# Patient Record
Sex: Female | Born: 1960 | Race: White | Hispanic: No | Smoking: Current every day smoker
Health system: Southern US, Community
[De-identification: ages and names within clinical notes are randomized; demographics above are authoritative.]

## PROBLEM LIST (undated history)

## (undated) DIAGNOSIS — S83249A Other tear of medial meniscus, current injury, unspecified knee, initial encounter: Secondary | ICD-10-CM

## (undated) DIAGNOSIS — T4145XA Adverse effect of unspecified anesthetic, initial encounter: Secondary | ICD-10-CM

## (undated) DIAGNOSIS — F32A Depression, unspecified: Secondary | ICD-10-CM

## (undated) DIAGNOSIS — F329 Major depressive disorder, single episode, unspecified: Secondary | ICD-10-CM

## (undated) DIAGNOSIS — F419 Anxiety disorder, unspecified: Secondary | ICD-10-CM

## (undated) DIAGNOSIS — T8859XA Other complications of anesthesia, initial encounter: Secondary | ICD-10-CM

## (undated) DIAGNOSIS — M1990 Unspecified osteoarthritis, unspecified site: Secondary | ICD-10-CM

## (undated) DIAGNOSIS — I10 Essential (primary) hypertension: Secondary | ICD-10-CM

## (undated) HISTORY — PX: ANKLE SURGERY: SHX546

## (undated) HISTORY — PX: CHOLECYSTECTOMY: SHX55

## (undated) HISTORY — PX: KNEE SURGERY: SHX244

## (undated) HISTORY — PX: SHOULDER ARTHROSCOPY: SHX128

---

## 2012-12-05 DIAGNOSIS — G43C0 Periodic headache syndromes in child or adult, not intractable: Secondary | ICD-10-CM | POA: Insufficient documentation

## 2012-12-05 DIAGNOSIS — J4522 Mild intermittent asthma with status asthmaticus: Secondary | ICD-10-CM | POA: Insufficient documentation

## 2012-12-05 DIAGNOSIS — E669 Obesity, unspecified: Secondary | ICD-10-CM | POA: Insufficient documentation

## 2012-12-05 DIAGNOSIS — G4700 Insomnia, unspecified: Secondary | ICD-10-CM | POA: Insufficient documentation

## 2012-12-05 DIAGNOSIS — F32A Depression, unspecified: Secondary | ICD-10-CM | POA: Insufficient documentation

## 2013-03-31 DIAGNOSIS — F17200 Nicotine dependence, unspecified, uncomplicated: Secondary | ICD-10-CM | POA: Insufficient documentation

## 2013-03-31 DIAGNOSIS — R921 Mammographic calcification found on diagnostic imaging of breast: Secondary | ICD-10-CM | POA: Insufficient documentation

## 2013-04-22 DIAGNOSIS — J4489 Other specified chronic obstructive pulmonary disease: Secondary | ICD-10-CM | POA: Insufficient documentation

## 2013-10-11 DIAGNOSIS — E785 Hyperlipidemia, unspecified: Secondary | ICD-10-CM | POA: Insufficient documentation

## 2013-10-11 DIAGNOSIS — F411 Generalized anxiety disorder: Secondary | ICD-10-CM | POA: Insufficient documentation

## 2013-10-11 DIAGNOSIS — I10 Essential (primary) hypertension: Secondary | ICD-10-CM | POA: Insufficient documentation

## 2014-11-11 ENCOUNTER — Other Ambulatory Visit: Payer: Self-pay | Admitting: Orthopedic Surgery

## 2014-11-11 ENCOUNTER — Encounter (HOSPITAL_BASED_OUTPATIENT_CLINIC_OR_DEPARTMENT_OTHER): Payer: Self-pay | Admitting: *Deleted

## 2014-11-15 ENCOUNTER — Other Ambulatory Visit: Payer: Self-pay

## 2014-11-15 ENCOUNTER — Encounter (HOSPITAL_BASED_OUTPATIENT_CLINIC_OR_DEPARTMENT_OTHER)
Admission: RE | Admit: 2014-11-15 | Discharge: 2014-11-15 | Disposition: A | Discharge status: Home / Self Care | Payer: 59 | Source: Ambulatory Visit | Attending: Orthopedic Surgery | Admitting: Orthopedic Surgery

## 2014-11-15 DIAGNOSIS — Z9049 Acquired absence of other specified parts of digestive tract: Secondary | ICD-10-CM | POA: Diagnosis not present

## 2014-11-15 DIAGNOSIS — Z87891 Personal history of nicotine dependence: Secondary | ICD-10-CM | POA: Diagnosis not present

## 2014-11-15 DIAGNOSIS — F419 Anxiety disorder, unspecified: Secondary | ICD-10-CM | POA: Diagnosis not present

## 2014-11-15 DIAGNOSIS — Z886 Allergy status to analgesic agent status: Secondary | ICD-10-CM | POA: Diagnosis not present

## 2014-11-15 DIAGNOSIS — F329 Major depressive disorder, single episode, unspecified: Secondary | ICD-10-CM | POA: Diagnosis not present

## 2014-11-15 DIAGNOSIS — M2342 Loose body in knee, left knee: Secondary | ICD-10-CM | POA: Diagnosis present

## 2014-11-15 DIAGNOSIS — I10 Essential (primary) hypertension: Secondary | ICD-10-CM | POA: Diagnosis not present

## 2014-11-15 DIAGNOSIS — M94262 Chondromalacia, left knee: Secondary | ICD-10-CM | POA: Diagnosis not present

## 2014-11-15 NOTE — H&P (Signed)
Paige Woods is an 54 y.o. female.    Chief Complaint: Left Knee Pain  HPI: Patient is here today for follow up for left knee pain.  This patient was last seen on 08/04/14 at that time she received a cortisone injection.  She states that the injection did provide approximately 75% relief.  She has noticed continued popping and some mild pain.  She denies any new injury.  She denies any fevers chills night sweats or other signs of infection.  Again she does have a prior history of partial meniscectomy done in [place] approximately 3-4 years ago.  She denies any adverse effects from the injection.  She denies any fevers chills night sweats or other signs of infection.  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  Past Medical History  Diagnosis Date  . Hypertension   . Depression   . Anxiety   . Arthritis   . Complication of anesthesia     PONV  . Acute medial meniscal tear     left    Past Surgical History  Procedure Laterality Date  . Cholecystectomy    . Knee surgery Left   . Ankle surgery Right   . Shoulder arthroscopy Left   . Cesarean section      History reviewed. No pertinent family history. Social History:  reports that she quit smoking about 4 weeks ago. She does not have any smokeless tobacco history on file. She reports that she does not drink alcohol or use illicit drugs.  Allergies:  Allergies  Allergen Reactions  . Ketoprofen Nausea And Vomiting  . Tramadol Nausea And Vomiting    No prescriptions prior to admission    No results found for this or any previous visit (from the past 48 hour(s)). No results found.  Review of Systems  Constitutional: Negative.   HENT: Negative.   Eyes:       Glasses  Respiratory: Negative.   Cardiovascular: Negative.   Gastrointestinal: Negative.   Genitourinary: Negative.   Musculoskeletal: Positive for joint pain.  Skin: Negative.   Neurological:  Negative.   Endo/Heme/Allergies: Negative.   Psychiatric/Behavioral: Positive for depression. The patient is nervous/anxious.     Height 6\' 2"  (1.88 m), weight 90.719 kg (200 lb). Physical Exam  Constitutional: She is oriented to person, place, and time. She appears well-developed and well-nourished.  HENT:  Head: Normocephalic and atraumatic.  Eyes: Pupils are equal, round, and reactive to light.  Neck: Normal range of motion. Neck supple.  Cardiovascular: Intact distal pulses.   Respiratory: Effort normal.  Musculoskeletal: She exhibits tenderness.  Patient continues to notice pain over the medial and lateral joint lines.  She has a range from 0-140.  McMurray's test continues to cause palpable clicking.  She has brisk capillary refill and is neurovascularly intact distally.  Neurological: She is alert and oriented to person, place, and time.  Skin: Skin is warm and dry.  Psychiatric: She has a normal mood and affect. Her behavior is normal. Judgment and thought content normal.     Assessment/Plan Assess: Possible meniscal tear versus chondromalacia  Plan: Options are once again discussed with the patient.  Patient states that as she is 75% better today she would like to continue with conservative treatment for the next several weeks to possibly a couple months.  She does wish to discuss surgery in the future.  She has elected to rule out a  posting slip and then call Paige Woods in the future when she would like to schedule and proceed with surgery.  She is given Paige Woods card.  She may continue with rest ice and over-the-counter medications as needed for relief.  Follow up as needed.  Call with any issues.  PHILLIPS, ERIC R 11/15/2014, 12:40 PM

## 2014-11-15 NOTE — Progress Notes (Signed)
Called [facility] in [place] and had them fax labs done first of May, 2016.

## 2014-11-16 ENCOUNTER — Ambulatory Visit (HOSPITAL_BASED_OUTPATIENT_CLINIC_OR_DEPARTMENT_OTHER)
Admission: RE | Admit: 2014-11-16 | Discharge: 2014-11-16 | Disposition: A | Discharge status: Home / Self Care | Payer: 59 | Source: Ambulatory Visit | Attending: Orthopedic Surgery | Admitting: Orthopedic Surgery

## 2014-11-16 ENCOUNTER — Ambulatory Visit (HOSPITAL_BASED_OUTPATIENT_CLINIC_OR_DEPARTMENT_OTHER): Payer: 59 | Admitting: Anesthesiology

## 2014-11-16 ENCOUNTER — Encounter (HOSPITAL_BASED_OUTPATIENT_CLINIC_OR_DEPARTMENT_OTHER): Payer: Self-pay

## 2014-11-16 ENCOUNTER — Encounter (HOSPITAL_BASED_OUTPATIENT_CLINIC_OR_DEPARTMENT_OTHER)
Admission: RE | Disposition: A | Discharge status: Home / Self Care | Payer: Self-pay | Source: Ambulatory Visit | Attending: Orthopedic Surgery

## 2014-11-16 DIAGNOSIS — M94262 Chondromalacia, left knee: Secondary | ICD-10-CM | POA: Diagnosis not present

## 2014-11-16 DIAGNOSIS — I10 Essential (primary) hypertension: Secondary | ICD-10-CM | POA: Insufficient documentation

## 2014-11-16 DIAGNOSIS — Z9049 Acquired absence of other specified parts of digestive tract: Secondary | ICD-10-CM | POA: Insufficient documentation

## 2014-11-16 DIAGNOSIS — M2342 Loose body in knee, left knee: Secondary | ICD-10-CM | POA: Insufficient documentation

## 2014-11-16 DIAGNOSIS — F419 Anxiety disorder, unspecified: Secondary | ICD-10-CM | POA: Insufficient documentation

## 2014-11-16 DIAGNOSIS — F329 Major depressive disorder, single episode, unspecified: Secondary | ICD-10-CM | POA: Insufficient documentation

## 2014-11-16 DIAGNOSIS — Z886 Allergy status to analgesic agent status: Secondary | ICD-10-CM | POA: Insufficient documentation

## 2014-11-16 DIAGNOSIS — Z87891 Personal history of nicotine dependence: Secondary | ICD-10-CM | POA: Insufficient documentation

## 2014-11-16 HISTORY — DX: Adverse effect of unspecified anesthetic, initial encounter: T41.45XA

## 2014-11-16 HISTORY — DX: Other tear of medial meniscus, current injury, unspecified knee, initial encounter: S83.249A

## 2014-11-16 HISTORY — DX: Major depressive disorder, single episode, unspecified: F32.9

## 2014-11-16 HISTORY — DX: Unspecified osteoarthritis, unspecified site: M19.90

## 2014-11-16 HISTORY — DX: Other complications of anesthesia, initial encounter: T88.59XA

## 2014-11-16 HISTORY — DX: Essential (primary) hypertension: I10

## 2014-11-16 HISTORY — DX: Anxiety disorder, unspecified: F41.9

## 2014-11-16 HISTORY — PX: CHONDROPLASTY: SHX5177

## 2014-11-16 HISTORY — PX: KNEE ARTHROSCOPY WITH DRILLING/MICROFRACTURE: SHX6425

## 2014-11-16 HISTORY — DX: Depression, unspecified: F32.A

## 2014-11-16 LAB — POCT HEMOGLOBIN-HEMACUE: Hemoglobin: 13.6 g/dL (ref 12.0–15.0)

## 2014-11-16 SURGERY — ARTHROSCOPY, KNEE, WITH ABRASION ARTHROPLASTY OR MICROFRACTURE
Anesthesia: General | Site: Knee | Laterality: Left

## 2014-11-16 MED ORDER — HYDROCODONE-ACETAMINOPHEN 5-325 MG PO TABS: 1.0000 | ORAL_TABLET | Freq: Four times a day (QID) | ORAL | Status: DC | PRN

## 2014-11-16 MED ORDER — HYDROMORPHONE HCL 1 MG/ML IJ SOLN
INTRAMUSCULAR | Status: AC
  Filled 2014-11-16: qty 1

## 2014-11-16 MED ORDER — OXYCODONE HCL 5 MG PO TABS
5.0000 mg | ORAL_TABLET | Freq: Once | ORAL | Status: AC | PRN
  Administered 2014-11-16: 5 mg via ORAL

## 2014-11-16 MED ORDER — DEXAMETHASONE SODIUM PHOSPHATE 4 MG/ML IJ SOLN
INTRAMUSCULAR | Status: DC | PRN
  Administered 2014-11-16: 10 mg via INTRAVENOUS

## 2014-11-16 MED ORDER — HYDROMORPHONE HCL 1 MG/ML IJ SOLN
0.2500 mg | INTRAMUSCULAR | Status: DC | PRN
  Administered 2014-11-16: 0.5 mg via INTRAVENOUS

## 2014-11-16 MED ORDER — EPINEPHRINE HCL 1 MG/ML IJ SOLN
INTRAMUSCULAR | Status: AC
  Filled 2014-11-16: qty 1

## 2014-11-16 MED ORDER — GLYCOPYRROLATE 0.2 MG/ML IJ SOLN: 0.2000 mg | Freq: Once | INTRAMUSCULAR | Status: DC | PRN

## 2014-11-16 MED ORDER — OXYCODONE HCL 5 MG/5ML PO SOLN: 5.0000 mg | Freq: Once | ORAL | Status: AC | PRN

## 2014-11-16 MED ORDER — PROPOFOL 10 MG/ML IV BOLUS
INTRAVENOUS | Status: AC
  Filled 2014-11-16: qty 20

## 2014-11-16 MED ORDER — LIDOCAINE HCL (CARDIAC) 20 MG/ML IV SOLN
INTRAVENOUS | Status: DC | PRN
  Administered 2014-11-16: 50 mg via INTRAVENOUS

## 2014-11-16 MED ORDER — PROPOFOL 10 MG/ML IV BOLUS
INTRAVENOUS | Status: DC | PRN
  Administered 2014-11-16: 200 mg via INTRAVENOUS

## 2014-11-16 MED ORDER — EPINEPHRINE HCL 1 MG/ML IJ SOLN
INTRAMUSCULAR | Status: DC | PRN
  Administered 2014-11-16: 1 mg

## 2014-11-16 MED ORDER — FENTANYL CITRATE (PF) 100 MCG/2ML IJ SOLN: 50.0000 ug | INTRAMUSCULAR | Status: DC | PRN

## 2014-11-16 MED ORDER — LACTATED RINGERS IV SOLN
INTRAVENOUS | Status: DC
Start: 2014-11-16 — End: 2014-11-16
  Administered 2014-11-16 (×2): via INTRAVENOUS

## 2014-11-16 MED ORDER — MIDAZOLAM HCL 2 MG/2ML IJ SOLN
1.0000 mg | INTRAMUSCULAR | Status: DC | PRN
  Administered 2014-11-16: 2 mg via INTRAVENOUS

## 2014-11-16 MED ORDER — CEFAZOLIN SODIUM-DEXTROSE 2-3 GM-% IV SOLR: 2.0000 g | INTRAVENOUS | Status: DC

## 2014-11-16 MED ORDER — FENTANYL CITRATE (PF) 100 MCG/2ML IJ SOLN
INTRAMUSCULAR | Status: DC | PRN
  Administered 2014-11-16: 100 ug via INTRAVENOUS

## 2014-11-16 MED ORDER — BUPIVACAINE-EPINEPHRINE 0.5% -1:200000 IJ SOLN
INTRAMUSCULAR | Status: DC | PRN
  Administered 2014-11-16: 20 mL

## 2014-11-16 MED ORDER — OXYCODONE HCL 5 MG PO TABS
ORAL_TABLET | ORAL | Status: AC
  Filled 2014-11-16: qty 1

## 2014-11-16 MED ORDER — MIDAZOLAM HCL 2 MG/2ML IJ SOLN
INTRAMUSCULAR | Status: AC
  Filled 2014-11-16: qty 2

## 2014-11-16 MED ORDER — ONDANSETRON HCL 4 MG/2ML IJ SOLN
INTRAMUSCULAR | Status: DC | PRN
  Administered 2014-11-16: 4 mg via INTRAVENOUS

## 2014-11-16 MED ORDER — FENTANYL CITRATE (PF) 100 MCG/2ML IJ SOLN
INTRAMUSCULAR | Status: AC
  Filled 2014-11-16: qty 4

## 2014-11-16 MED ORDER — SODIUM CHLORIDE 0.9 % IR SOLN
Status: DC | PRN
  Administered 2014-11-16: 6000 mL

## 2014-11-16 MED ORDER — CHLORHEXIDINE GLUCONATE 4 % EX LIQD: 60.0000 mL | Freq: Once | CUTANEOUS | Status: DC

## 2014-11-16 MED ORDER — MEPERIDINE HCL 25 MG/ML IJ SOLN: 6.2500 mg | INTRAMUSCULAR | Status: DC | PRN

## 2014-11-16 SURGICAL SUPPLY — 41 items
BANDAGE ELASTIC 6 VELCRO ST LF (GAUZE/BANDAGES/DRESSINGS) ×3 IMPLANT
BLADE 4.2CUDA (BLADE) IMPLANT
BLADE CUTTER GATOR 3.5 (BLADE) ×3 IMPLANT
BLADE GREAT WHITE 4.2 (BLADE) IMPLANT
BNDG COHESIVE 6X5 TAN STRL LF (GAUZE/BANDAGES/DRESSINGS) ×3 IMPLANT
DRAPE ARTHROSCOPY W/POUCH 114 (DRAPES) ×3 IMPLANT
DURAPREP 26ML APPLICATOR (WOUND CARE) ×3 IMPLANT
ELECT MENISCUS 165MM 90D (ELECTRODE) IMPLANT
ELECT REM PT RETURN 9FT ADLT (ELECTROSURGICAL)
ELECTRODE REM PT RTRN 9FT ADLT (ELECTROSURGICAL) IMPLANT
GAUZE SPONGE 4X4 12PLY STRL (GAUZE/BANDAGES/DRESSINGS) ×3 IMPLANT
GAUZE XEROFORM 1X8 LF (GAUZE/BANDAGES/DRESSINGS) ×3 IMPLANT
GLOVE BIO SURGEON STRL SZ 6.5 (GLOVE) ×3 IMPLANT
GLOVE BIO SURGEON STRL SZ7.5 (GLOVE) ×3 IMPLANT
GLOVE BIO SURGEON STRL SZ8.5 (GLOVE) ×3 IMPLANT
GLOVE BIOGEL PI IND STRL 7.0 (GLOVE) ×2 IMPLANT
GLOVE BIOGEL PI IND STRL 8 (GLOVE) ×2 IMPLANT
GLOVE BIOGEL PI IND STRL 9 (GLOVE) ×2 IMPLANT
GLOVE BIOGEL PI INDICATOR 7.0 (GLOVE) ×1
GLOVE BIOGEL PI INDICATOR 8 (GLOVE) ×1
GLOVE BIOGEL PI INDICATOR 9 (GLOVE) ×1
GOWN STRL REUS W/ TWL LRG LVL3 (GOWN DISPOSABLE) ×4 IMPLANT
GOWN STRL REUS W/TWL LRG LVL3 (GOWN DISPOSABLE) ×2
GOWN STRL REUS W/TWL XL LVL3 (GOWN DISPOSABLE) ×3 IMPLANT
IV NS IRRIG 3000ML ARTHROMATIC (IV SOLUTION) ×6 IMPLANT
KNEE WRAP E Z 3 GEL PACK (MISCELLANEOUS) ×3 IMPLANT
MANIFOLD NEPTUNE II (INSTRUMENTS) ×3 IMPLANT
NDL SAFETY ECLIPSE 18X1.5 (NEEDLE) ×2 IMPLANT
NEEDLE HYPO 18GX1.5 SHARP (NEEDLE) ×1
PACK ARTHROSCOPY DSU (CUSTOM PROCEDURE TRAY) ×3 IMPLANT
PACK BASIN DAY SURGERY FS (CUSTOM PROCEDURE TRAY) ×3 IMPLANT
PAD ALCOHOL SWAB (MISCELLANEOUS) ×3 IMPLANT
PENCIL BUTTON HOLSTER BLD 10FT (ELECTRODE) IMPLANT
SET ARTHROSCOPY TUBING (MISCELLANEOUS) ×1
SET ARTHROSCOPY TUBING LN (MISCELLANEOUS) ×2 IMPLANT
SLEEVE SCD COMPRESS KNEE MED (MISCELLANEOUS) ×3 IMPLANT
SYR 3ML 18GX1 1/2 (SYRINGE) IMPLANT
SYR 5ML LL (SYRINGE) ×3 IMPLANT
TOWEL OR 17X24 6PK STRL BLUE (TOWEL DISPOSABLE) ×3 IMPLANT
WAND STAR VAC 90 (SURGICAL WAND) IMPLANT
WATER STERILE IRR 1000ML POUR (IV SOLUTION) ×3 IMPLANT

## 2014-11-16 NOTE — Anesthesia Postprocedure Evaluation (Signed)
  Anesthesia Post-op Note  Patient: Paige Woods  Procedure(s) Performed: Procedure(s): LEFT ARTHROSCOPY KNEE (Left) KNEE ARTHROSCOPY WITH DRILLING/MICROFRACTURE (Left) CHONDROPLASTY (Left)  Patient Location: PACU  Anesthesia Type: General   Level of Consciousness: awake, alert  and oriented  Airway and Oxygen Therapy: Patient Spontanous Breathing  Post-op Pain: mild  Post-op Assessment: Post-op Vital signs reviewed  Post-op Vital Signs: Reviewed  Last Vitals:  Filed Vitals:   11/16/14 1315  BP: 103/65  Pulse: 84  Temp: 36.6 C  Resp: 20    Complications: No apparent anesthesia complications

## 2014-11-16 NOTE — Transfer of Care (Signed)
Immediate Anesthesia Transfer of Care Note  Patient: Paige Woods  Procedure(s) Performed: Procedure(s): LEFT ARTHROSCOPY KNEE (Left) KNEE ARTHROSCOPY WITH DRILLING/MICROFRACTURE (Left) CHONDROPLASTY (Left)  Patient Location: PACU  Anesthesia Type:General  Level of Consciousness: awake, alert , oriented and patient cooperative  Airway & Oxygen Therapy: Patient Spontanous Breathing and Patient connected to face mask oxygen  Post-op Assessment: Report given to RN and Post -op Vital signs reviewed and stable  Post vital signs: Reviewed and stable  Last Vitals:  Filed Vitals:   11/16/14 1024  BP: 110/64  Pulse: 96  Temp: 36.6 C  Resp: 18    Complications: No apparent anesthesia complications

## 2014-11-16 NOTE — Anesthesia Procedure Notes (Signed)
Procedure Name: LMA Insertion Performed by: Ainslee Sou,  Pre-anesthesia Checklist: Patient identified, Emergency Drugs available, Suction available and Patient being monitored Patient Re-evaluated:Patient Re-evaluated prior to inductionOxygen Delivery Method: Circle System Utilized Preoxygenation: Pre-oxygenation with 100% oxygen Intubation Type: IV induction Ventilation: Mask ventilation without difficulty LMA: LMA inserted LMA Size: 4.0 Number of attempts: 1 Airway Equipment and Method: Bite block Placement Confirmation: positive ETCO2 Tube secured with: Tape Dental Injury: Teeth and Oropharynx as per pre-operative assessment      

## 2014-11-16 NOTE — Op Note (Signed)
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   Procedure: Left knee arthroscopic removal of loose bodies, debridement of flap tears, microfracture of trochlea.  Surgeon: Feliberto GottronFrank J. Turner Danielsowan M.D.  Assist: Tomi LikensEric K. Gaylene BrooksPhillips PA-C  (present throughout entire procedure and necessary for timely completion of the procedure) Anes: General LMA  EBL: Minimal  Fluids: 800 cc   Indications: Catching popping and pain in the left knee. Pain began after injury. Pt has failed conservative treatment with anti-inflammatory medicines, physical therapy, and modified activites but did get good temporarily from an intra-articular cortisone injection. Pain has recurred and patient desires elective arthroscopic evaluation and treatment of knee. Risks and benefits of surgery have been discussed and questions answered.  Procedure: Patient identified by arm band and taken to the operating room at the day surgery Center. The appropriate anesthetic monitors were attached, and General LMA anesthesia was induced without difficulty. Lateral post was applied to the table and the lower extremity was prepped and draped in usual sterile fashion from the ankle to the midthigh. Time out procedure was performed. We began the operation by making standard inferior lateral and inferior medial peripatellar portals with a #11 blade allowing introduction of the arthroscope through the inferior lateral portal and the out flow to the inferior medial portal. Pump pressure was set at 100 mmHg and diagnostic arthroscopy  revealed grade 4 chondromalacia of the trochlea with large flap tears were debrided back to a stable margin with a straight biter and then finished with a 3.5 Gator sucker shaver. This is over a little over 1.5 cm diameter area. Using the microfracture picks we then perforated the subchondral bone to bring in a  blood supply and encourage new cartilage. The medial compartment was in excellent condition as was the lateral compartment there was minimal chondromalacia the cruciate ligaments were intact the menisci are intact and were probed. We did find multiple chondral loose bodies in the articular fluid and these were taken through the outflow or removed with the 3.5 mm Gator sucker shaver a couple of the loose bodies were a centimeter in length. The knee was irrigated out normal saline solution. A dressing of xerofoam 4 x 4 dressing sponges, web roll and an Ace wrap was applied. The patient was awakened extubated and taken to the recovery without difficulty.    Signed: Nestor LewandowskyFrank J Margreat Widener, MD

## 2014-11-16 NOTE — Interval H&P Note (Signed)
History and Physical Interval Note:  11/16/2014 11:35 AM  Paige Woods  has presented today for surgery, with the diagnosis of LEFT KNEE MEDIAL MENISCUS TEAR AND CHONDROMALCIA  The various methods of treatment have been discussed with the patient and family. After consideration of risks, benefits and other options for treatment, the patient has consented to  Procedure(s): LEFT ARTHROSCOPY KNEE (Left) as a surgical intervention .  The patient's history has been reviewed, patient examined, no change in status, stable for surgery.  I have reviewed the patient's chart and labs.  Questions were answered to the patient's satisfaction.     Nestor LewandowskyOWAN,Brilee [place]

## 2014-11-16 NOTE — Anesthesia Preprocedure Evaluation (Signed)
Anesthesia Evaluation  Patient identified by MRN, date of birth, ID band Patient awake    Reviewed: Allergy & Precautions, NPO status , Patient's Chart, lab work & pertinent test results  Airway Mallampati: II  TM Distance: >3 FB Neck ROM: Full    Dental  (+) Teeth Intact, Dental Advisory Given   Pulmonary former smoker,  breath sounds clear to auscultation        Cardiovascular hypertension, Pt. on medications Rhythm:Regular Rate:Normal     Neuro/Psych    GI/Hepatic   Endo/Other    Renal/GU      Musculoskeletal   Abdominal   Peds  Hematology   Anesthesia Other Findings   Reproductive/Obstetrics                             Anesthesia Physical Anesthesia Plan  ASA: II  Anesthesia Plan: General   Post-op Pain Management:    Induction: Intravenous  Airway Management Planned: LMA  Additional Equipment:   Intra-op Plan:   Post-operative Plan: Extubation in OR  Informed Consent: I have reviewed the patients History and Physical, chart, labs and discussed the procedure including the risks, benefits and alternatives for the proposed anesthesia with the patient or authorized representative who has indicated his/her understanding and acceptance.   Dental advisory given  Plan Discussed with: CRNA, Anesthesiologist and Surgeon  Anesthesia Plan Comments:         Anesthesia Quick Evaluation

## 2014-11-16 NOTE — Discharge Instructions (Addendum)
Arthroscopic Procedure, Knee °An arthroscopic procedure can find what is wrong with your knee. °PROCEDURE °Arthroscopy is a surgical technique that allows your orthopedic surgeon to diagnose and treat your knee injury with accuracy. They will look into your knee through a small instrument. This is almost like a small (pencil sized) telescope. Because arthroscopy affects your knee less than open knee surgery, you can anticipate a more rapid recovery. Taking an active role by following your caregiver's instructions will help with rapid and complete recovery. Use crutches, rest, elevation, ice, and knee exercises as instructed. The length of recovery depends on various factors including type of injury, age, physical condition, medical conditions, and your rehabilitation. °Your knee is the joint between the large bones (femur and tibia) in your leg. Cartilage covers these bone ends which are smooth and slippery and allow your knee to bend and move smoothly. Two menisci, thick, semi-lunar shaped pads of cartilage which form a rim inside the joint, help absorb shock and stabilize your knee. Ligaments bind the bones together and support your knee joint. Muscles move the joint, help support your knee, and take stress off the joint itself. Because of this all programs and physical therapy to rehabilitate an injured or repaired knee require rebuilding and strengthening your muscles. °AFTER THE PROCEDURE °· After the procedure, you will be moved to a recovery area until most of the effects of the medication have worn off. Your caregiver will discuss the test results with you. °· Only take over-the-counter or prescription medicines for pain, discomfort, or fever as directed by your caregiver. °SEEK MEDICAL CARE IF:  °· You have increased bleeding from your wounds. °· You see redness, swelling, or have increasing pain in your wounds. °· You have pus coming from your wound. °· You have an oral temperature above 102° F (38.9°  C). °· You notice a bad smell coming from the wound or dressing. °· You have severe pain with any motion of your knee. °SEEK IMMEDIATE MEDICAL CARE IF:  °· You develop a rash. °· You have difficulty breathing. °· You have any allergic problems. °Document Released: 06/14/2000 Document Revised: 09/09/2011 Document Reviewed: 01/06/2008 °Exit[employer]® Patient Information ©2015 [employer]. This information is not intended to replace advice given to you by your health care provider. Make sure you discuss any questions you have with your health care provider. ° ° °Post Anesthesia Home Care Instructions ° °Activity: °Get plenty of rest for the remainder of the day. A responsible adult should stay with you for 24 hours following the procedure.  °For the next 24 hours, DO NOT: °-Drive a car °-Operate machinery °-Drink alcoholic beverages °-Take any medication unless instructed by your physician °-Make any legal decisions or sign important papers. ° °Meals: °Start with liquid foods such as gelatin or soup. Progress to regular foods as tolerated. Avoid greasy, spicy, heavy foods. If nausea and/or vomiting occur, drink only clear liquids until the nausea and/or vomiting subsides. Call your physician if vomiting continues. ° °Special Instructions/Symptoms: °Your throat may feel dry or sore from the anesthesia or the breathing tube placed in your throat during surgery. If this causes discomfort, gargle with warm salt water. The discomfort should disappear within 24 hours. ° °If you had a scopolamine patch placed behind your ear for the management of post- operative nausea and/or vomiting: ° °1. The medication in the patch is effective for 72 hours, after which it should be removed.  Wrap patch in a tissue and discard in the trash. Wash   hands thoroughly with soap and water. °2. You may remove the patch earlier than 72 hours if you experience unpleasant side effects which may include dry mouth, dizziness or visual disturbances. °3.  Avoid touching the patch. Wash your hands with soap and water after contact with the patch. °  ° °

## 2014-11-17 ENCOUNTER — Encounter (HOSPITAL_BASED_OUTPATIENT_CLINIC_OR_DEPARTMENT_OTHER): Payer: Self-pay | Admitting: Orthopedic Surgery

## 2014-11-23 ENCOUNTER — Encounter (HOSPITAL_BASED_OUTPATIENT_CLINIC_OR_DEPARTMENT_OTHER): Payer: Self-pay | Admitting: Orthopedic Surgery

## 2016-02-23 ENCOUNTER — Emergency Department (HOSPITAL_COMMUNITY): Payer: BLUE CROSS/BLUE SHIELD

## 2016-02-23 ENCOUNTER — Emergency Department (HOSPITAL_COMMUNITY)
Admission: EM | Admit: 2016-02-23 | Discharge: 2016-02-23 | Disposition: A | Discharge status: Home / Self Care | Payer: BLUE CROSS/BLUE SHIELD | Attending: Emergency Medicine | Admitting: Emergency Medicine

## 2016-02-23 ENCOUNTER — Encounter (HOSPITAL_COMMUNITY): Payer: Self-pay | Admitting: Vascular Surgery

## 2016-02-23 DIAGNOSIS — Y999 Unspecified external cause status: Secondary | ICD-10-CM | POA: Insufficient documentation

## 2016-02-23 DIAGNOSIS — Y929 Unspecified place or not applicable: Secondary | ICD-10-CM | POA: Insufficient documentation

## 2016-02-23 DIAGNOSIS — S99921A Unspecified injury of right foot, initial encounter: Secondary | ICD-10-CM | POA: Diagnosis present

## 2016-02-23 DIAGNOSIS — Z87891 Personal history of nicotine dependence: Secondary | ICD-10-CM | POA: Diagnosis not present

## 2016-02-23 DIAGNOSIS — S92514A Nondisplaced fracture of proximal phalanx of right lesser toe(s), initial encounter for closed fracture: Secondary | ICD-10-CM | POA: Insufficient documentation

## 2016-02-23 DIAGNOSIS — I10 Essential (primary) hypertension: Secondary | ICD-10-CM | POA: Insufficient documentation

## 2016-02-23 DIAGNOSIS — Y9302 Activity, running: Secondary | ICD-10-CM | POA: Insufficient documentation

## 2016-02-23 DIAGNOSIS — W2203XA Walked into furniture, initial encounter: Secondary | ICD-10-CM | POA: Diagnosis not present

## 2016-02-23 DIAGNOSIS — S92911A Unspecified fracture of right toe(s), initial encounter for closed fracture: Secondary | ICD-10-CM

## 2016-02-23 MED ORDER — ACETAMINOPHEN 325 MG PO TABS
650.0000 mg | ORAL_TABLET | Freq: Once | ORAL | Status: AC
  Administered 2016-02-23: 650 mg via ORAL
  Filled 2016-02-23: qty 2

## 2016-02-23 MED ORDER — HYDROCODONE-ACETAMINOPHEN 5-325 MG PO TABS: 1.0000 | ORAL_TABLET | Freq: Four times a day (QID) | ORAL | 0 refills | Status: DC | PRN

## 2016-02-23 NOTE — ED Notes (Signed)
Pt stable, ambulatory, states understanding of discharge instructions 

## 2016-02-23 NOTE — ED Provider Notes (Signed)
[facility]-EMERGENCY DEPT Provider Note   CSN: [id number] Arrival date & time: 02/23/16  1854  By signing my name below, I, Linna Darner, attest that this documentation has been prepared under the direction and in the presence of Arvilla Meres, PA-C. Electronically Signed: Linna Darner, Scribe. 02/23/2016. 7:39 PM.  History   Chief Complaint Chief Complaint  Patient presents with  . Foot Injury    The history is provided by the patient. No language interpreter was used.     HPI Comments: Paige Woods is a 55 y.o. female with PMHx of arthritis who presents to the Emergency Department complaining of sudden onset, constant, worsening, 4th and 5th right toe pain beginning 4 days ago. Pt states she stubbed her right toes by running into her desk at work 4 days ago; she notes she was not wearing socks when this occurred. Pt also notes some swelling of her right foot. She reports she has worked all week and is on her feet all day at work which has worsened her pain. Pt has tried ibuprofen, naprosyn, ice, and buddy tape with no relief of her pain. Pt endorses pain exacerbation with weight bearing on her right toes and has been walking on her right heel to avoid putting pressure on her right toes. She also endorses pain exacerbation with palpation to her 4th and 5th right toes; she rates her pain as 6/10 currently and 9/10 with palpation. Pt denies h/o immunocompromised conditions or current steroid medication use. She denies numbness/tingling, neuro deficits, redness, warmth, fever, or any other associated symptoms.  Past Medical History:  Diagnosis Date  . Acute medial meniscal tear    left  . Anxiety   . Arthritis   . Complication of anesthesia    PONV  . Depression   . Hypertension     There are no active problems to display for this patient.   Past Surgical History:  Procedure Laterality Date  . ANKLE SURGERY Right   . CESAREAN SECTION    . CHOLECYSTECTOMY    . CHONDROPLASTY Left  11/16/2014   Procedure: CHONDROPLASTY;  Surgeon: Gean Birchwood, MD;  Location: [facility];  Service: Orthopedics;  Laterality: Left;  . KNEE ARTHROSCOPY WITH DRILLING/MICROFRACTURE Left 11/16/2014   Procedure: KNEE ARTHROSCOPY WITH DRILLING/MICROFRACTURE;  Surgeon: Gean Birchwood, MD;  Location: [facility];  Service: Orthopedics;  Laterality: Left;  . KNEE SURGERY Left   . SHOULDER ARTHROSCOPY Left     OB History    No data available       Home Medications    Prior to Admission medications   Medication Sig Start Date End Date Taking? Authorizing Provider  citalopram (CELEXA) 40 MG tablet Take 40 mg by mouth daily.    Historical Provider, MD  HYDROcodone-acetaminophen (NORCO/VICODIN) 5-325 MG tablet Take 1 tablet by mouth every 6 (six) hours as needed. 02/23/16   Lona Kettle, PA-C  lisinopril-hydrochlorothiazide (PRINZIDE,ZESTORETIC) 10-12.5 MG per tablet Take 1 tablet by mouth daily.    Historical Provider, MD  LORazepam (ATIVAN) 0.5 MG tablet Take 0.5 mg by mouth every 8 (eight) hours.    Historical Provider, MD  naproxen (NAPROSYN) 500 MG tablet Take 500 mg by mouth 2 (two) times daily as needed.    Historical Provider, MD  simvastatin (ZOCOR) 20 MG tablet Take 20 mg by mouth daily.    Historical Provider, MD    Family History No family history on file.  Social History Social History  Substance Use Topics  .  Smoking status: Former Smoker    Quit date: 10/12/2014  . Smokeless tobacco: Never Used  . Alcohol use No     Allergies   Ketoprofen and Tramadol   Review of Systems Review of Systems  Constitutional: Negative for fever.  Musculoskeletal: Positive for arthralgias (4th and 5th right toes) and joint swelling (right foot).  Skin: Negative for color change.  Allergic/Immunologic: Negative for immunocompromised state.  Neurological: Negative for numbness.       Negative for sensation loss.    Physical Exam Updated Vital Signs BP  96/78 (BP Location: Left Arm)   Pulse 83   Temp 97.9 F (36.6 C) (Oral)   Resp 18   SpO2 100%   Physical Exam  Constitutional: She is oriented to person, place, and time. She appears well-developed and well-nourished. No distress.  HENT:  Head: Normocephalic and atraumatic.  Eyes: Conjunctivae and EOM are normal.  Neck: Neck supple. No tracheal deviation present.  Cardiovascular: Normal rate.   Pulmonary/Chest: Effort normal. No respiratory distress.  Musculoskeletal: Normal range of motion.  Right foot: tenderness to distal third through fifth metatarsals as well as 3rd through 5th phalanges, no warmth or redness is appreciated. Swelling appreciated at 5th phalanx. Good DP pulse. Cap refill less than 3 seconds. Sensation intact. Patient able to move all toes.   Neurological: She is alert and oriented to person, place, and time.  Skin: Skin is warm and dry.  Psychiatric: She has a normal mood and affect. Her behavior is normal.  Nursing note and vitals reviewed.   ED Treatments / Results  Labs (all labs ordered are listed, but only abnormal results are displayed) Labs Reviewed - No data to display  EKG  EKG Interpretation None       Radiology Dg Foot Complete Right  Result Date: 02/23/2016 CLINICAL DATA:  Stubbed right for the past 5 days ago EXAM: RIGHT FOOT COMPLETE - 3+ VIEW COMPARISON:  None. FINDINGS: There is a nondisplaced fracture of the proximal shaft of the fifth proximal phalanx without angulation. There is no evidence of arthropathy or other focal bone abnormality. Soft tissues are unremarkable. IMPRESSION: 1. Nondisplaced fracture of the proximal shaft of the fifth proximal phalanx without angulation. Electronically Signed   By: Elige Ko   On: 02/23/2016 19:46    Procedures Procedures (including critical care time)  DIAGNOSTIC STUDIES: Oxygen Saturation is 100% on RA, normal by my interpretation.    COORDINATION OF CARE: 7:39 PM Discussed treatment  plan with pt at bedside and pt agreed to plan.  Medications Ordered in ED Medications  acetaminophen (TYLENOL) tablet 650 mg (650 mg Oral Given 02/23/16 1959)     Initial Impression / Assessment and Plan / ED Course  I have reviewed the triage vital signs and the nursing notes.  Pertinent labs & imaging results that were available during my care of the patient were reviewed by me and considered in my medical decision making (see chart for details).  Clinical Course  Value Comment By Time  DG Foot Complete Right Reviewed Lona Kettle, PA-C 08/25 1959    Patient presents to ED for right toe pain. Patient is afebrile and non-toxic appearing in NAD. VSS. Physical exam remarkable for TTP of 3rd-5th distal right metatarsal and phalanx, with worsening pain at 5th distal metatarsal and phalanx. Mild swelling appreciated. No warmth or erythema. Neurovascularly intact. X-ray remarkable for nondisplaced proximal fracture of right 5th proximal phalanx, no angulation. Discussed results with patient. Patient toes buddy  taped and placed in post op boot. Encouraged follow up with orthopedics. Patient goes to [facility]. Rx. Vicodin. Review of [place] narcotic database shows no recent Rx for narcotic medication. Symptomatic management discussed. Return precautions given. Patient voiced understanding and is agreeable.   I personally performed the services described in this documentation, which was scribed in my presence. The recorded information has been reviewed and is accurate.   Final Clinical Impressions(s) / ED Diagnoses   Final diagnoses:  Toe fracture, right, closed, initial encounter    New Prescriptions New Prescriptions   HYDROCODONE-ACETAMINOPHEN (NORCO/VICODIN) 5-325 MG TABLET    Take 1 tablet by mouth every 6 (six) hours as needed.     Lona Kettleshley Laurel Meyer, [place] JerseyPA-C 02/23/16 2020    Linwood DibblesJon Knapp, MD 02/24/16 [phone]

## 2016-02-23 NOTE — Discharge Instructions (Signed)
Read the information below.   You have a fracture in your 5th toe. It is important that you wear the post-op shoe until you are evaluated by orthopedics. Continue to rest, ice, and elevate your leg. You can take tylenol 650mg  or motrin 400mg  every 6hrs.  I have prescribed Vicodin for severe breakthrough pain, you stated you have taken this medication without issue in the past. Take as needed.  Use the prescribed medication as directed.  Please discuss all new medications with your pharmacist.   It is important that you follow up with orthopedics. You stated you are a patient of [facility]. Please call on Monday to schedule a follow up appointment.  You may return to the Emergency Department at any time for worsening condition or any new symptoms that concern you. Return to ED if you develop a fever, numbness, weakness, change in color of your toe, or unable to manage pain at home.

## 2016-02-23 NOTE — ED Triage Notes (Signed)
Pt reports to the ED for eval of right foot pain. She tripped and stubbed her toes on her desk. Pt ambulatory but weight bearing makes the pain worse. Injury occurred on Monday night but she reports the pain has been getting progressively worse. Pt A&Ox4, resp e/u, and skin warm and dry.

## 2016-02-23 NOTE — ED Notes (Signed)
Ortho tech paged  

## 2016-02-23 NOTE — Progress Notes (Signed)
Orthopedic Tech Progress Note Patient Details:  Paige Woods 1961-02-04 [id number]  Ortho Devices Type of Ortho Device: Postop shoe/boot Ortho Device/Splint Location: RLE Ortho Device/Splint Interventions: Ordered, Application   Paige Woods, Paige Woods 02/23/2016, 8:20 PM

## 2016-04-12 DIAGNOSIS — Z9181 History of falling: Secondary | ICD-10-CM | POA: Insufficient documentation

## 2016-11-21 DIAGNOSIS — N183 Chronic kidney disease, stage 3 unspecified: Secondary | ICD-10-CM | POA: Insufficient documentation

## 2017-07-11 DIAGNOSIS — R5381 Other malaise: Secondary | ICD-10-CM | POA: Insufficient documentation

## 2017-07-11 DIAGNOSIS — E7800 Pure hypercholesterolemia, unspecified: Secondary | ICD-10-CM | POA: Insufficient documentation

## 2017-07-11 DIAGNOSIS — R072 Precordial pain: Secondary | ICD-10-CM | POA: Insufficient documentation

## 2018-01-16 IMAGING — CR DG FOOT COMPLETE 3+V*R*
3 series · 3 of 3 positions shown · non-contrast
Comparison: None.

CLINICAL DATA: Stubbed right for the past 5 days ago

EXAM:
RIGHT FOOT COMPLETE - 3+ VIEW

[foot ap]
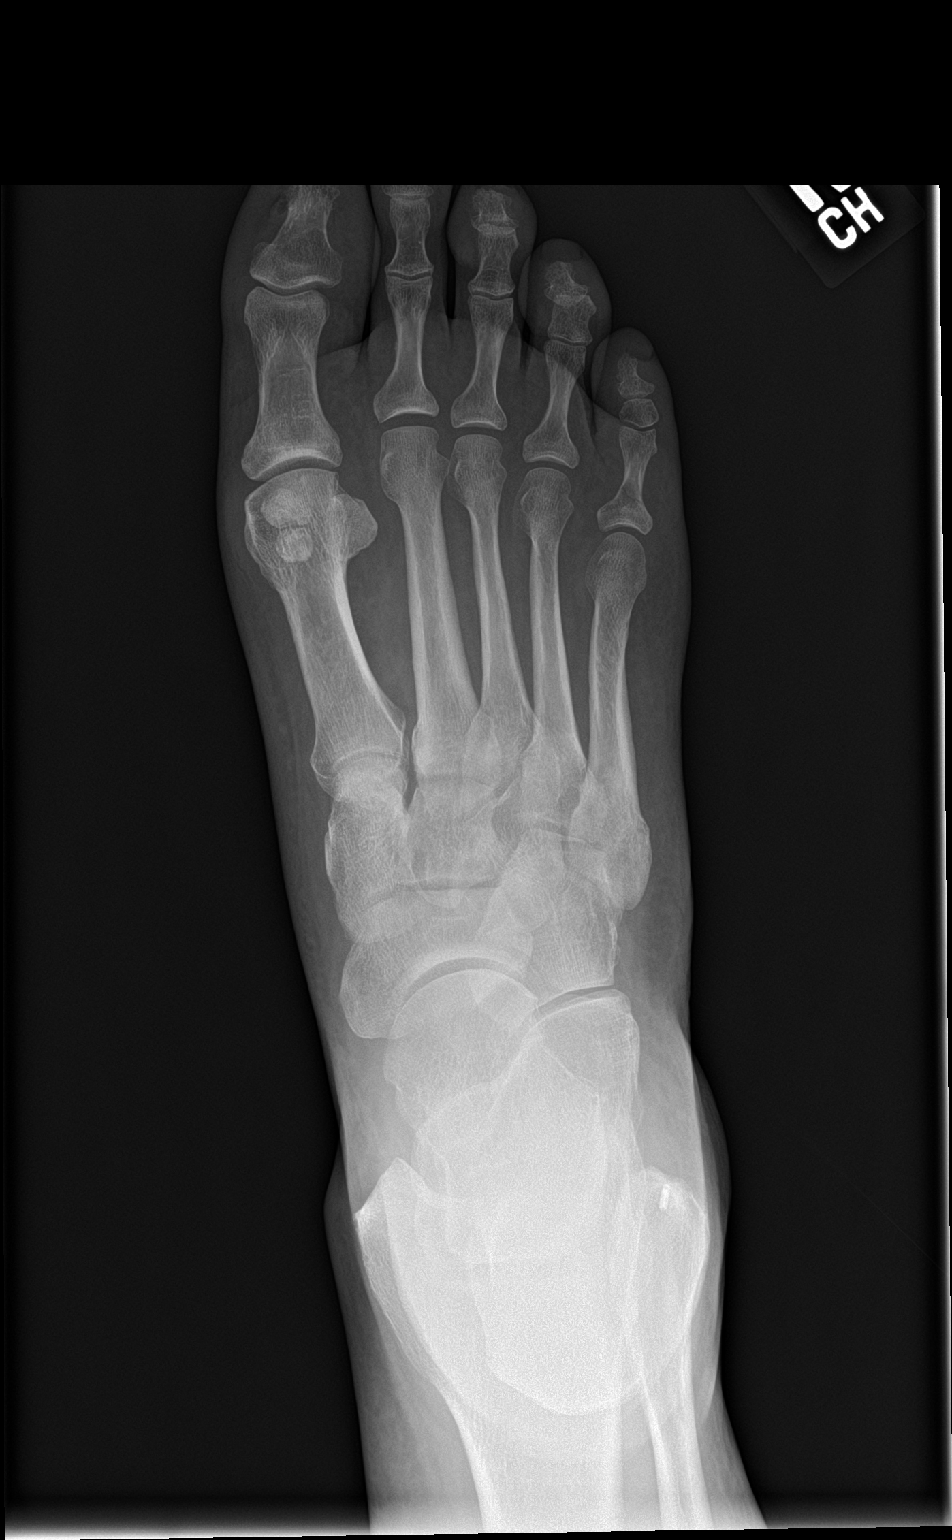

[foot obl]
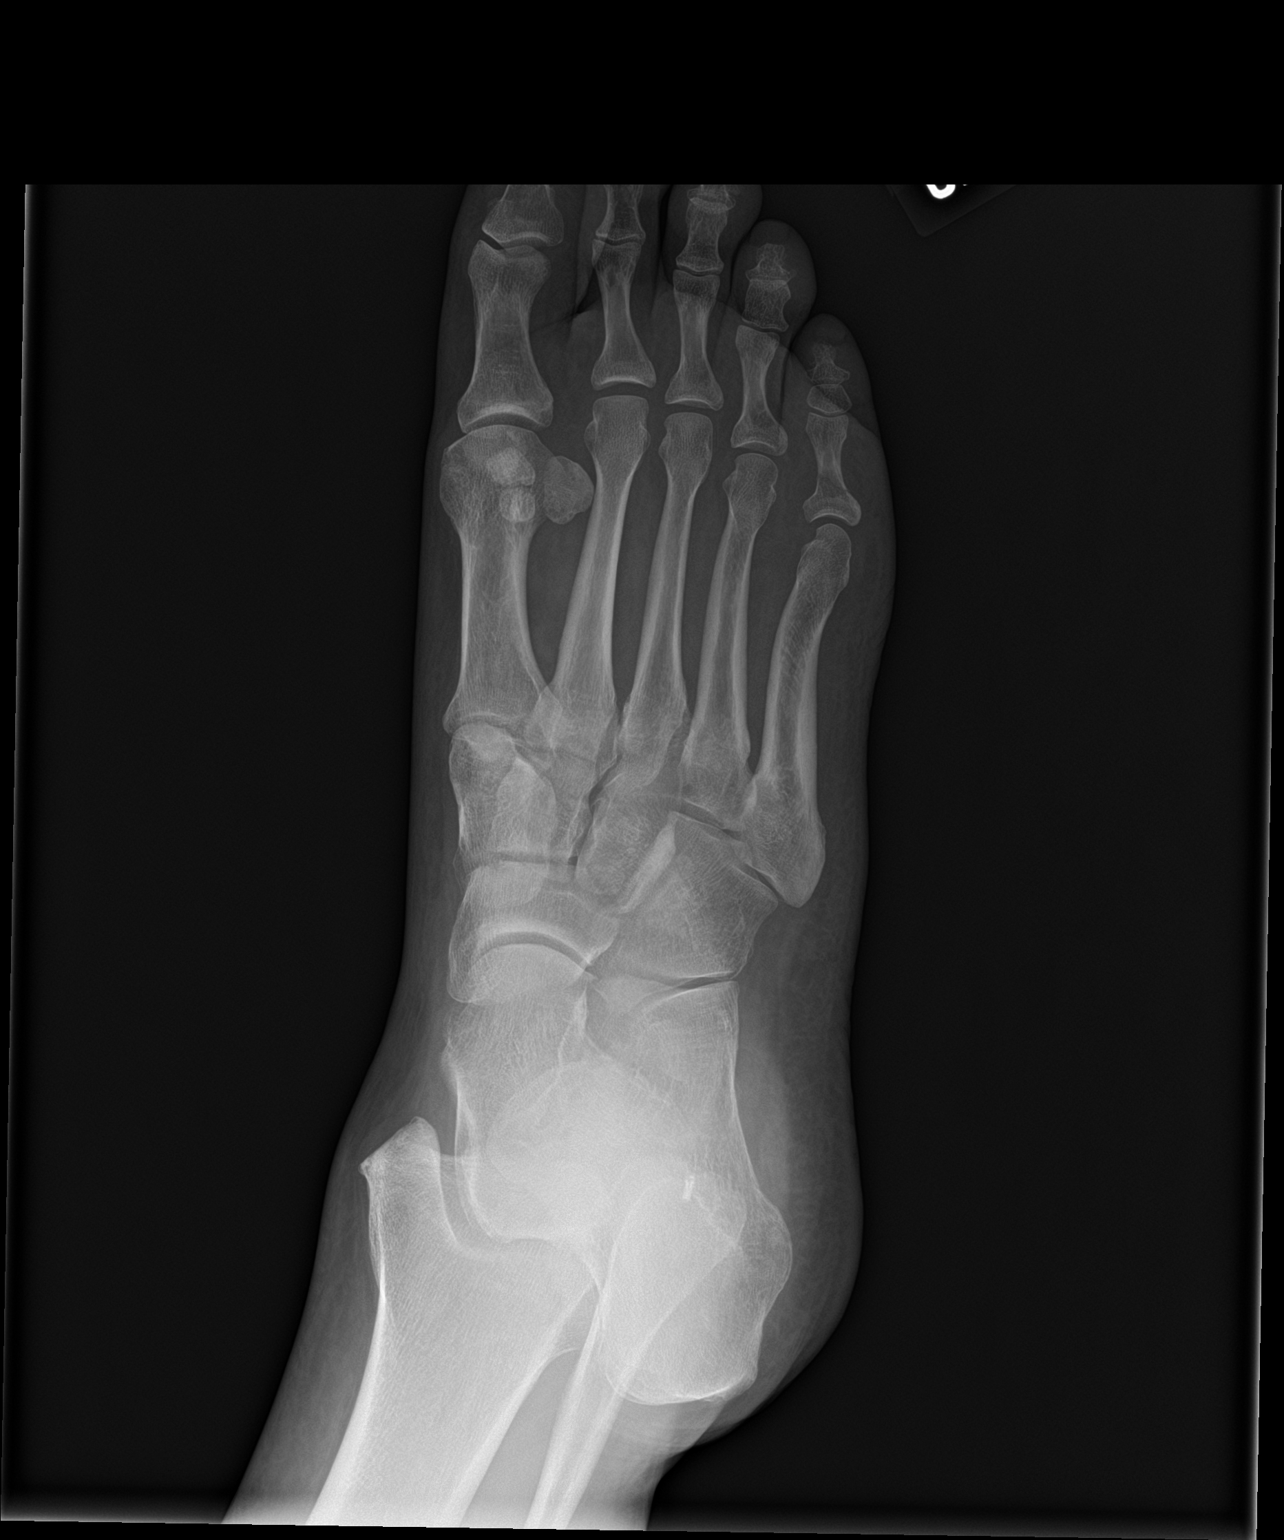

[foot lat]
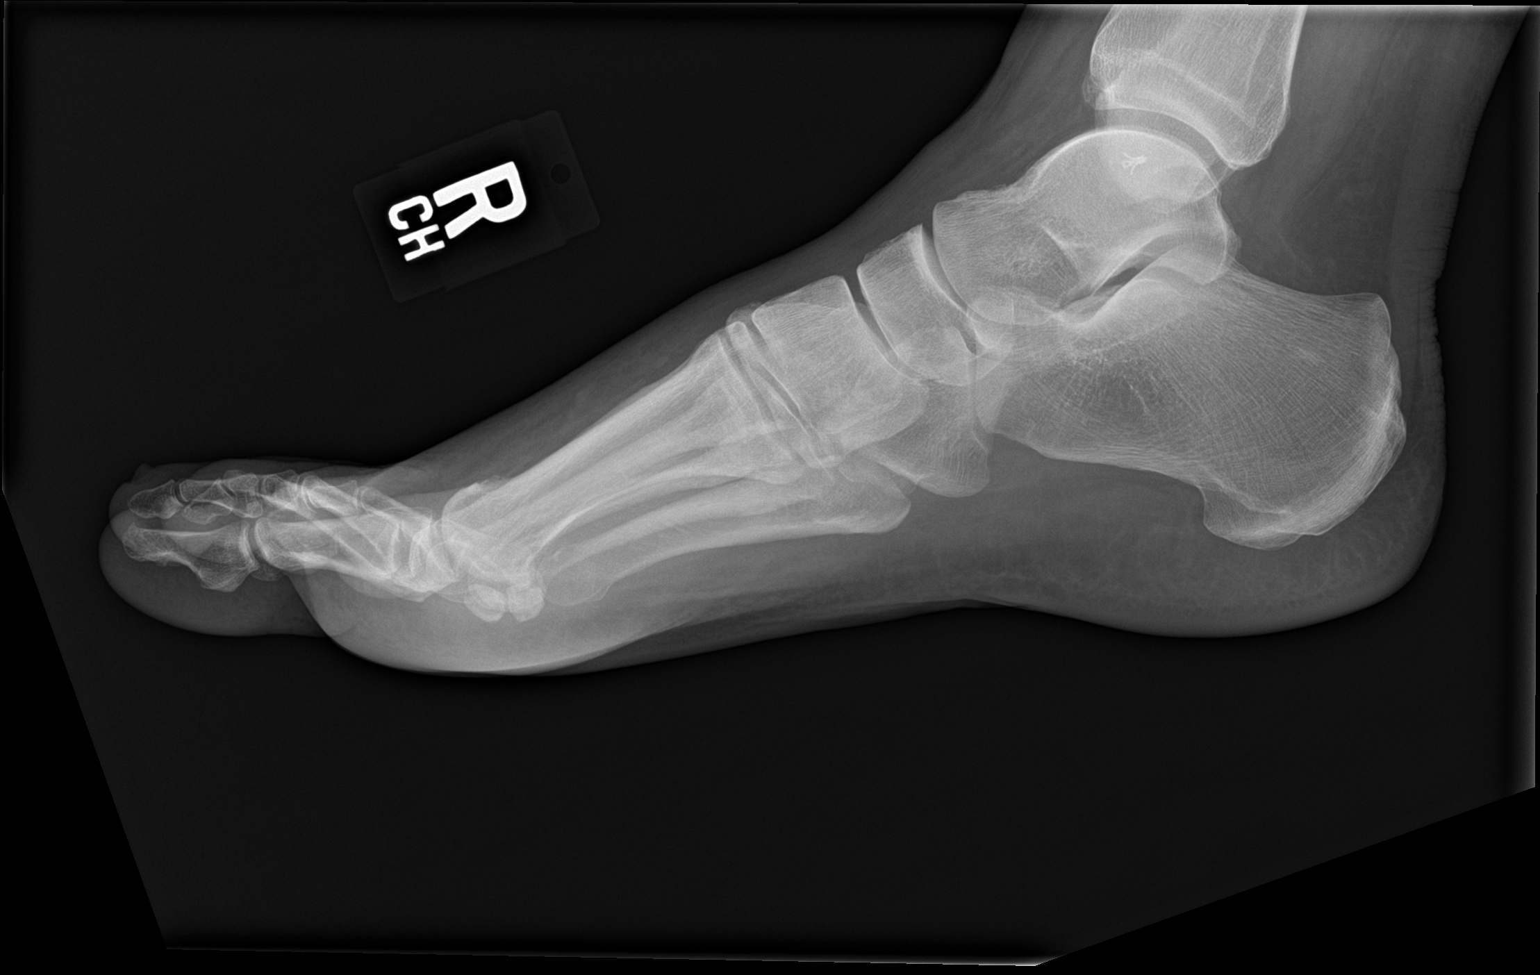

[3 of 3 positions shown; findings below may reference images not displayed]

FINDINGS: There is a nondisplaced fracture of the proximal shaft of the fifth
proximal phalanx without angulation. There is no evidence of
arthropathy or other focal bone abnormality. Soft tissues are
unremarkable.
IMPRESSION: 1. Nondisplaced fracture of the proximal shaft of the fifth proximal
phalanx without angulation.

## 2019-05-11 DIAGNOSIS — M62830 Muscle spasm of back: Secondary | ICD-10-CM | POA: Insufficient documentation

## 2019-05-11 DIAGNOSIS — M5432 Sciatica, left side: Secondary | ICD-10-CM | POA: Insufficient documentation

## 2020-06-22 ENCOUNTER — Ambulatory Visit
Admission: EM | Admit: 2020-06-22 | Discharge: 2020-06-22 | Disposition: A | Discharge status: Home / Self Care | Payer: PRIVATE HEALTH INSURANCE

## 2020-06-22 ENCOUNTER — Other Ambulatory Visit: Payer: Self-pay

## 2020-06-22 DIAGNOSIS — M5441 Lumbago with sciatica, right side: Secondary | ICD-10-CM

## 2020-06-22 MED ORDER — PREDNISONE 10 MG (21) PO TBPK: ORAL_TABLET | Freq: Every day | ORAL | 0 refills | Status: DC

## 2020-06-22 MED ORDER — CYCLOBENZAPRINE HCL 5 MG PO TABS: 5.0000 mg | ORAL_TABLET | Freq: Two times a day (BID) | ORAL | 0 refills | Status: AC | PRN

## 2020-06-22 NOTE — ED Triage Notes (Signed)
Patient complains of a sciatic issue flaring up. Pt states that she has pain, and numbness on her right side and shooting down her leg. Pt is aox4 and ambulates with a limp.

## 2020-06-22 NOTE — ED Provider Notes (Signed)
[name]    CSN: [id number] Arrival date & time: 06/22/20  1743      History   Chief Complaint Chief Complaint  Patient presents with  . Back Pain    sciatic nerve pain x 2 days    HPI Paige Woods is a 59 y.o. female  presents for left sciatic flareup.  States pain is radiating down to her leg.  Has tried otc meds without relief.  Denies trauma/injury to the affected area and does not recall an inciting event.  Denies fever, saddle area anesthesia, lower extremity numbness/weakness, urinary retention, fecal incontinence.  Past Medical History:  Diagnosis Date  . Acute medial meniscal tear    left  . Anxiety   . Arthritis   . Complication of anesthesia    PONV  . Depression   . Hypertension     There are no problems to display for this patient.   Past Surgical History:  Procedure Laterality Date  . ANKLE SURGERY Right   . CESAREAN SECTION    . CHOLECYSTECTOMY    . CHONDROPLASTY Left 11/16/2014   Procedure: CHONDROPLASTY;  Surgeon: Gean Birchwood, MD;  Location: [facility];  Service: Orthopedics;  Laterality: Left;  . KNEE ARTHROSCOPY WITH DRILLING/MICROFRACTURE Left 11/16/2014   Procedure: KNEE ARTHROSCOPY WITH DRILLING/MICROFRACTURE;  Surgeon: Gean Birchwood, MD;  Location: [facility];  Service: Orthopedics;  Laterality: Left;  . KNEE SURGERY Left   . SHOULDER ARTHROSCOPY Left     OB History   No obstetric history on file.      Home Medications    Prior to Admission medications   Medication Sig Start Date End Date Taking? Authorizing Provider  citalopram (CELEXA) 40 MG tablet Take 40 mg by mouth daily.   Yes [provider]  hydrochlorothiazide (HYDRODIURIL) 25 MG tablet Take 25 mg by mouth daily.   Yes [provider]  LORazepam (ATIVAN) 0.5 MG tablet Take 0.5 mg by mouth every 8 (eight) hours.   Yes [provider]  naproxen (NAPROSYN) 500 MG tablet Take 500 mg by mouth 2 (two)  times daily as needed.   Yes [provider]  simvastatin (ZOCOR) 20 MG tablet Take 20 mg by mouth daily.   Yes [provider]  cyclobenzaprine (FLEXERIL) 5 MG tablet Take 1 tablet (5 mg total) by mouth 2 (two) times daily as needed for up to 7 days for muscle spasms. 06/22/20 06/29/20  Hall-Potvin, [place], PA-C  predniSONE (STERAPRED UNI-PAK 21 TAB) 10 MG (21) TBPK tablet Take by mouth daily. Take steroid taper as written 06/22/20   Hall-Potvin, [place], PA-C  lisinopril-hydrochlorothiazide (PRINZIDE,ZESTORETIC) 10-12.5 MG per tablet Take 1 tablet by mouth daily.  06/22/20  [provider]    Family History History reviewed. No pertinent family history.  Social History Social History   Tobacco Use  . Smoking status: Former Smoker    Quit date: 10/12/2014    Years since quitting: 5.6  . Smokeless tobacco: Never Used  Vaping Use  . Vaping Use: Never used  Substance Use Topics  . Alcohol use: No  . Drug use: No     Allergies   Ketoprofen and Tramadol   Review of Systems Review of Systems  Constitutional: Negative for fatigue and fever.  HENT: Negative for ear pain, sinus pain, sore throat and voice change.   Eyes: Negative for pain, redness and visual disturbance.  Respiratory: Negative for cough and shortness of breath.   Cardiovascular: Negative for  chest pain and palpitations.  Gastrointestinal: Negative for abdominal pain, diarrhea and vomiting.  Musculoskeletal: Positive for back pain. Negative for arthralgias and myalgias.  Skin: Negative for rash and wound.  Neurological: Negative for syncope and headaches.     Physical Exam Triage Vital Signs ED Triage Vitals  Enc Vitals Group     BP 06/22/20 1914 (!) 153/87     Pulse Rate 06/22/20 1914 89     Resp 06/22/20 1914 18     Temp 06/22/20 1914 98.4 F (36.9 C)     Temp Source 06/22/20 1914 Oral     SpO2 06/22/20 1914 99 %     Weight --      Height --      Head Circumference --       Peak Flow --      Pain Score 06/22/20 1916 8     Pain Loc --      Pain Edu? --      Excl. in GC? --    No data found.  Updated Vital Signs BP (!) 153/87 (BP Location: Right Arm)   Pulse 89   Temp 98.4 F (36.9 C) (Oral)   Resp 18   SpO2 99%   Visual Acuity Right Eye Distance:   Left Eye Distance:   Bilateral Distance:    Right Eye Near:   Left Eye Near:    Bilateral Near:     Physical Exam Constitutional:      General: She is not in acute distress. HENT:     Head: Normocephalic and atraumatic.  Eyes:     General: No scleral icterus.    Pupils: Pupils are equal, round, and reactive to light.  Cardiovascular:     Rate and Rhythm: Normal rate.  Pulmonary:     Effort: Pulmonary effort is normal.  Musculoskeletal:     Comments: Diffuse right lumbar tenderness that spares spinous process, PSIS.  Positive SLR of right, negative left.  NVI.  Skin:    Coloration: Skin is not jaundiced or pale.  Neurological:     Mental Status: She is alert and oriented to person, place, and time.      UC Treatments / Results  Labs (all labs ordered are listed, but only abnormal results are displayed) Labs Reviewed - No data to display  EKG   Radiology No results found.  Procedures Procedures (including critical care time)  Medications Ordered in UC Medications - No data to display  Initial Impression / Assessment and Plan / UC Course  I have reviewed the triage vital signs and the nursing notes.  Pertinent labs & imaging results that were available during my care of the patient were reviewed by me and considered in my medical decision making (see chart for details).     To be consistent with right low back pain with sciatica.  Will treat supportively as below, follow-up with sports medicine as needed.  Return precautions discussed, pt verbalized understanding and is agreeable to plan. Final Clinical Impressions(s) / UC Diagnoses   Final diagnoses:  Acute right-sided  low back pain with right-sided sciatica   Discharge Instructions   None    ED Prescriptions    Medication Sig Dispense Auth. Provider   predniSONE (STERAPRED UNI-PAK 21 TAB) 10 MG (21) TBPK tablet Take by mouth daily. Take steroid taper as written 10 tablet Hall-Potvin, [place], PA-C   cyclobenzaprine (FLEXERIL) 5 MG tablet Take 1 tablet (5 mg total) by mouth 2 (two) times daily as  needed for up to 7 days for muscle spasms. 14 tablet Hall-Potvin, [place], PA-C     PDMP not reviewed this encounter.   Hall-Potvin, [place], [place] 06/22/20 2006

## 2021-03-10 ENCOUNTER — Other Ambulatory Visit: Payer: Self-pay

## 2021-03-10 ENCOUNTER — Ambulatory Visit (HOSPITAL_COMMUNITY)
Admission: EM | Admit: 2021-03-10 | Discharge: 2021-03-10 | Disposition: A | Discharge status: Home / Self Care | Payer: PRIVATE HEALTH INSURANCE

## 2021-03-10 ENCOUNTER — Encounter (HOSPITAL_COMMUNITY): Payer: Self-pay | Admitting: Emergency Medicine

## 2021-03-10 DIAGNOSIS — R059 Cough, unspecified: Secondary | ICD-10-CM

## 2021-03-10 DIAGNOSIS — I10 Essential (primary) hypertension: Secondary | ICD-10-CM | POA: Diagnosis not present

## 2021-03-10 NOTE — ED Provider Notes (Signed)
[facility]    CSN: [id number] Arrival date & time: 03/10/21  1731      History   Chief Complaint No chief complaint on file.   HPI EMERI ESTILL is a 60 y.o. female presenting with vague complaint of intermittent bilateral finger numbness, lip numbness, nonproductive cough for about 2 days.  Medical history of attention, anxiety, arthritis, sciatica.  States the symptoms started after sitting at a sport event, but denies new neck or back pain.  Feels like tingling in bilateral fingers and lips, lasts for about 3 minutes.  Has not identified triggers or mitigating factors.  Vague complaint of lightheadedness, she states that she thinks this is because her blood pressure has been bottoming out.  Her primary care recently switched her from hydrochlorothiazide to lisinopril, states that she has had this issue on lisinopril in the past.  She actually stopped the lisinopril 1 day ago.  Has not monitor blood pressure at home.  Denies current lightheadedness, numbness or tingling in fingers or lips, shortness of breath, chest pain, worst headache of life, vision changes. 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   Denies fevers/chills, n/v/d, shortness of breath, chest pain, congestion, facial pain, teeth pain, headaches, sore throat, loss of taste/smell, swollen lymph nodes, ear pain.    HPI  Past Medical History:  Diagnosis Date   Acute medial meniscal tear    left   Anxiety    Arthritis    Complication of anesthesia    PONV   Depression    Hypertension     There are no problems to display for this patient.   Past Surgical History:  Procedure Laterality Date   ANKLE SURGERY Right    CESAREAN SECTION     CHOLECYSTECTOMY     CHONDROPLASTY Left 11/16/2014   Procedure: CHONDROPLASTY;  Surgeon: Gean Birchwood, MD;  Location: [facility];  Service: Orthopedics;  Laterality: Left;   KNEE ARTHROSCOPY WITH  DRILLING/MICROFRACTURE Left 11/16/2014   Procedure: KNEE ARTHROSCOPY WITH DRILLING/MICROFRACTURE;  Surgeon: Gean Birchwood, MD;  Location: [facility];  Service: Orthopedics;  Laterality: Left;   KNEE SURGERY Left    SHOULDER ARTHROSCOPY Left     OB History   No obstetric history on file.      Home Medications    Prior to Admission medications   Medication Sig Start Date End Date Taking? Authorizing Provider  citalopram (CELEXA) 40 MG tablet Take 40 mg by mouth daily.   Yes [provider]  lisinopril (ZESTRIL) 5 MG tablet Take 5 mg by mouth daily. 02/22/21  Yes [provider]  simvastatin (ZOCOR) 20 MG tablet Take 20 mg by mouth daily.   Yes [provider]  aspirin 81 MG EC tablet Take by mouth.    [provider]  hydrochlorothiazide (HYDRODIURIL) 25 MG tablet Take 25 mg by mouth daily. Patient not taking: Reported on 03/10/2021    [provider]  LORazepam (ATIVAN) 0.5 MG tablet Take 0.5 mg by mouth every 8 (eight) hours.    [provider]  naproxen (NAPROSYN) 500 MG tablet Take 500 mg by mouth 2 (two) times daily as needed.    [provider]  predniSONE (STERAPRED UNI-PAK 21 TAB) 10 MG (21) TBPK tablet Take by mouth daily. Take steroid taper as written Patient not taking: Reported on 03/10/2021 06/22/20   Hall-Potvin, [place], PA-C  lisinopril-hydrochlorothiazide (PRINZIDE,ZESTORETIC) 10-12.5 MG per tablet  Take 1 tablet by mouth daily.  06/22/20  [provider]    Family History Family History  Problem Relation Age of Onset   Diabetes Mother    CAD Mother     Social History Social History   Tobacco Use   Smoking status: Every Day    Types: Cigarettes    Last attempt to quit: 10/12/2014    Years since quitting: 6.4   Smokeless tobacco: Never  Vaping Use   Vaping Use: Never used  Substance Use Topics   Alcohol use: No   Drug use: No     Allergies   Ketoprofen and  Tramadol   Review of Systems Review of Systems  Constitutional:  Negative for appetite change, chills and fever.  HENT:  Negative for congestion, ear pain, rhinorrhea, sinus pressure, sinus pain and sore throat.   Eyes:  Negative for redness and visual disturbance.  Respiratory:  Positive for cough. Negative for chest tightness, shortness of breath and wheezing.   Cardiovascular:  Negative for chest pain and palpitations.  Gastrointestinal:  Negative for abdominal pain, constipation, diarrhea, nausea and vomiting.  Genitourinary:  Negative for dysuria, frequency and urgency.  Musculoskeletal:  Negative for myalgias.  Neurological:  Negative for dizziness, weakness and headaches.  Psychiatric/Behavioral:  Negative for confusion.   All other systems reviewed and are negative.   Physical Exam Triage Vital Signs ED Triage Vitals  Enc Vitals Group     BP 03/10/21 1753 119/76     Pulse Rate 03/10/21 1753 (!) 103     Resp 03/10/21 1753 20     Temp 03/10/21 1753 98.4 F (36.9 C)     Temp Source 03/10/21 1753 Oral     SpO2 03/10/21 1753 98 %     Weight --      Height --      Head Circumference --      Peak Flow --      Pain Score 03/10/21 1744 0     Pain Loc --      Pain Edu? --      Excl. in GC? --    Orthostatic VS for the past 24 hrs:  BP- Lying Pulse- Lying BP- Sitting Pulse- Sitting BP- Standing at 0 minutes Pulse- Standing at 0 minutes  03/10/21 1749 126/79 96 118/75 98 120/77 108    Updated Vital Signs BP 119/76 (BP Location: Right Arm)   Pulse (!) 103   Temp 98.4 F (36.9 C) (Oral)   Resp 20   SpO2 98%   Visual Acuity Right Eye Distance:   Left Eye Distance:   Bilateral Distance:    Right Eye Near:   Left Eye Near:    Bilateral Near:     Physical Exam Vitals reviewed.  Constitutional:      General: She is not in acute distress.    Appearance: Normal appearance. She is not ill-appearing.  HENT:     Head: Normocephalic and atraumatic.     Right Ear:  Tympanic membrane, ear canal and external ear normal. No tenderness. No middle ear effusion. There is no impacted cerumen. Tympanic membrane is not perforated, erythematous, retracted or bulging.     Left Ear: Tympanic membrane, ear canal and external ear normal. No tenderness.  No middle ear effusion. There is no impacted cerumen. Tympanic membrane is not perforated, erythematous, retracted or bulging.     Nose: Nose normal. No congestion.     Mouth/Throat:     Mouth: Mucous membranes are  moist.     Pharynx: Uvula midline. No oropharyngeal exudate or posterior oropharyngeal erythema.  Eyes:     Extraocular Movements: Extraocular movements intact.     Pupils: Pupils are equal, round, and reactive to light.  Cardiovascular:     Rate and Rhythm: Normal rate and regular rhythm.     Heart sounds: Normal heart sounds.  Pulmonary:     Effort: Pulmonary effort is normal.     Breath sounds: Normal breath sounds. No decreased breath sounds, wheezing, rhonchi or rales.  Abdominal:     Palpations: Abdomen is soft.     Tenderness: There is no abdominal tenderness. There is no guarding or rebound.  Musculoskeletal:     Cervical back: Normal range of motion and neck supple. No rigidity.  Lymphadenopathy:     Cervical: No cervical adenopathy.  Skin:    Capillary Refill: Capillary refill takes less than 2 seconds.  Neurological:     General: No focal deficit present.     Mental Status: She is alert and oriented to person, place, and time. Mental status is at baseline.     Cranial Nerves: Cranial nerves are intact. No cranial nerve deficit or facial asymmetry.     Sensory: Sensation is intact. No sensory deficit.     Motor: Motor function is intact. No weakness.     Coordination: Coordination is intact. Coordination normal.     Gait: Gait is intact. Gait normal.     Comments: AO x3. PERRLA, EOMI. CN 2-12 intact. No weakness or numbness in UEs or Les. Negative rhomberg, pronator drift, fingers to  thumb. Strength and sensation intact upper and lower extremities. Negative spurling bilaterally.  Psychiatric:        Mood and Affect: Mood normal.        Behavior: Behavior normal.        Thought Content: Thought content normal.        Judgment: Judgment normal.     UC Treatments / Results  Labs (all labs ordered are listed, but only abnormal results are displayed) Labs Reviewed - No data to display  EKG   Radiology No results found.  Procedures Procedures (including critical care time)  Medications Ordered in UC Medications - No data to display  Initial Impression / Assessment and Plan / UC Course  I have reviewed the triage vital signs and the nursing notes.  Pertinent labs & imaging results that were available during my care of the patient were reviewed by me and considered in my medical decision making (see chart for details).     This patient is a very pleasant 60 y.o. year old female presenting with vague complaint of intermittent numbness in her fingers and lips for about 2 days, also with nonproductive cough. Today this pt is afebrile nontachycardic nontachypneic, oxygenating well on room air, no wheezes rhonchi or rales. Benign neuro exam. Vitals wnl, including orthostatic vitals. She was recently started on lisinopril, stop this and follow-up with primary care in about 1 day.  She declines COVID test today. STRICT ED return precautions discussed. Patient verbalizes understanding and agreement.  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  Final Clinical Impressions(s) / UC Diagnoses   Final diagnoses:  Cough  Essential hypertension     Discharge Instructions      -Call your primary care on Monday to discuss blood pressure medication titration. -If you think you are having a stroke, including weakness on one side of your body, the worst headache of your life, vision  changes, new/worsening dizziness-call 911 immediately. -Take a COVID test in about 2 days to make sure that you  do not have COVID.   ED Prescriptions   None    PDMP not reviewed this encounter.   Rhys MartiniGraham, Charnee Turnipseed E, PA-C 03/10/21 1810

## 2021-03-10 NOTE — Discharge Instructions (Addendum)
-  Call your primary care on Monday to discuss blood pressure medication titration. -If you think you are having a stroke, including weakness on one side of your body, the worst headache of your life, vision changes, new/worsening dizziness-call 911 immediately. -Take a COVID test in about 2 days to make sure that you do not have COVID.

## 2021-03-10 NOTE — ED Triage Notes (Addendum)
Last night started having fingers and lips getting numb, then this goes away.  Patient did sleep last night, but intermittently was awakened.  Patient has a cough currently.  Patient gets a little light headed with standing  1 week ago pcp dropped hctz and started lisinopril.    States she has been on lisinopril before and it would "bottom out blood pressure"

## 2021-07-22 ENCOUNTER — Encounter (HOSPITAL_COMMUNITY): Payer: Self-pay

## 2021-07-22 ENCOUNTER — Ambulatory Visit (HOSPITAL_COMMUNITY)
Admission: EM | Admit: 2021-07-22 | Discharge: 2021-07-22 | Disposition: A | Discharge status: Home / Self Care | Payer: PRIVATE HEALTH INSURANCE | Attending: Emergency Medicine | Admitting: Emergency Medicine

## 2021-07-22 ENCOUNTER — Other Ambulatory Visit: Payer: Self-pay

## 2021-07-22 ENCOUNTER — Telehealth (HOSPITAL_COMMUNITY): Payer: Self-pay | Admitting: *Deleted

## 2021-07-22 DIAGNOSIS — J4521 Mild intermittent asthma with (acute) exacerbation: Secondary | ICD-10-CM

## 2021-07-22 DIAGNOSIS — J209 Acute bronchitis, unspecified: Secondary | ICD-10-CM | POA: Diagnosis not present

## 2021-07-22 DIAGNOSIS — J069 Acute upper respiratory infection, unspecified: Secondary | ICD-10-CM | POA: Diagnosis not present

## 2021-07-22 MED ORDER — PROMETHAZINE-DM 6.25-15 MG/5ML PO SYRP: 5.0000 mL | ORAL_SOLUTION | Freq: Four times a day (QID) | ORAL | 0 refills | Status: DC | PRN

## 2021-07-22 MED ORDER — METHYLPREDNISOLONE SODIUM SUCC 125 MG IJ SOLR
INTRAMUSCULAR | Status: AC
  Filled 2021-07-22: qty 2

## 2021-07-22 MED ORDER — LEVOFLOXACIN 500 MG PO TABS
500.0000 mg | ORAL_TABLET | Freq: Every day | ORAL | 0 refills | Status: AC
Start: 2021-07-22 — End: 2021-07-27

## 2021-07-22 MED ORDER — METHYLPREDNISOLONE 4 MG PO TBPK: ORAL_TABLET | ORAL | 0 refills | Status: DC

## 2021-07-22 MED ORDER — GUAIFENESIN 400 MG PO TABS: ORAL_TABLET | ORAL | 0 refills | Status: DC

## 2021-07-22 MED ORDER — METHYLPREDNISOLONE SODIUM SUCC 125 MG IJ SOLR
80.0000 mg | Freq: Once | INTRAMUSCULAR | Status: AC
  Administered 2021-07-22: 80 mg via INTRAMUSCULAR

## 2021-07-22 MED ORDER — FLUTICASONE PROPIONATE 50 MCG/ACT NA SUSP: 2.0000 | Freq: Every day | NASAL | 0 refills | Status: DC

## 2021-07-22 MED ORDER — METHYLPREDNISOLONE 4 MG PO TBPK
ORAL_TABLET | ORAL | 0 refills | Status: DC
Start: 2021-07-22 — End: 2022-10-17

## 2021-07-22 NOTE — ED Triage Notes (Signed)
Pt presents with non productive cough and nasal congestion X 1 week.

## 2021-07-22 NOTE — ED Provider Notes (Signed)
[facility]    CSN: [id number] Arrival date & time: 07/22/21  1014    HISTORY   Chief Complaint  Patient presents with   URI   HPI Lindell NoeRobin L Herrero is a 61 y.o. female. Patient complains of a nonproductive cough and nasal congestion x1 week.  Patient reports a history of mild intermittent asthma, has been using her albuterol inhaler 4 times daily for the past week, states initially was helping but now feels like her asthma is getting ahead of her.  Patient states he is also been taking Zyrtec every day for the past week.  States she has tried Robitussin, Robitussin-DM and NyQuil, all who provided some relief but only temporary.  Patient is at this point she feels that she may be wheezing and may need steroid.  Patient states she is a current every day smoker however has not smoked more than 1 cigarette in the past 2 days secondary to this upper respiratory infection she has.  The history is provided by the patient.  Past Medical History:  Diagnosis Date   Acute medial meniscal tear    left   Anxiety    Arthritis    Complication of anesthesia    PONV   Depression    Hypertension    There are no problems to display for this patient.  Past Surgical History:  Procedure Laterality Date   ANKLE SURGERY Right    CESAREAN SECTION     CHOLECYSTECTOMY     CHONDROPLASTY Left 11/16/2014   Procedure: CHONDROPLASTY;  Surgeon: Gean BirchwoodFrank Rowan, MD;  Location:  [facility];  Service: Orthopedics;  Laterality: Left;   KNEE ARTHROSCOPY WITH DRILLING/MICROFRACTURE Left 11/16/2014   Procedure: KNEE ARTHROSCOPY WITH DRILLING/MICROFRACTURE;  Surgeon: Gean BirchwoodFrank Rowan, MD;  Location:  SUR[facility]ERY CENTER;  Service: Orthopedics;  Laterality: Left;   KNEE SURGERY Left    SHOULDER ARTHROSCOPY Left    OB History   No obstetric history on file.    Home Medications    Prior to Admission medications   Medication Sig Start Date End Date Taking? Authorizing Provider  aspirin 81  MG EC tablet Take by mouth.    [provider]  citalopram (CELEXA) 40 MG tablet Take 40 mg by mouth daily.    [provider]  lisinopril (ZESTRIL) 5 MG tablet Take 5 mg by mouth daily. 02/22/21   [provider]  LORazepam (ATIVAN) 0.5 MG tablet Take 0.5 mg by mouth every 8 (eight) hours.    [provider]  naproxen (NAPROSYN) 500 MG tablet Take 500 mg by mouth 2 (two) times daily as needed.    [provider]  simvastatin (ZOCOR) 20 MG tablet Take 20 mg by mouth daily.    [provider]  lisinopril-hydrochlorothiazide (PRINZIDE,ZESTORETIC) 10-12.5 MG per tablet Take 1 tablet by mouth daily.  06/22/20  [provider]   Family History Family History  Problem Relation Age of Onset   Diabetes Mother    CAD Mother    Social History Social History   Tobacco Use   Smoking status: Every Day    Types: Cigarettes    Last attempt to quit: 10/12/2014    Years since quitting: 6.7   Smokeless tobacco: Never  Vaping Use   Vaping Use: Never used  Substance Use Topics   Alcohol use: No   Drug use: No   Allergies   Ketoprofen and Tramadol  Review of Systems Review of Systems Pertinent findings noted in history of present  illness.   Physical Exam Triage Vital Signs ED Triage Vitals  Enc Vitals Group     BP 04/27/21 0827 (!) 147/82     Pulse Rate 04/27/21 0827 72     Resp 04/27/21 0827 18     Temp 04/27/21 0827 98.3 F (36.8 C)     Temp Source 04/27/21 0827 Oral     SpO2 04/27/21 0827 98 %     Weight --      Height --      Head Circumference --      Peak Flow --      Pain Score 04/27/21 0826 5     Pain Loc --      Pain Edu? --      Excl. in GC? --   No data found.  Updated Vital Signs BP (!) 174/90 (BP Location: Right Arm)    Pulse 89    Temp 97.8 F (36.6 C) (Oral)    Resp 18    SpO2 99%   Physical Exam Vitals and nursing note reviewed.  Constitutional:      General: She is not in acute distress.     Appearance: Normal appearance. She is not ill-appearing.  HENT:     Head: Normocephalic and atraumatic.     Salivary Glands: Right salivary gland is not diffusely enlarged or tender. Left salivary gland is not diffusely enlarged or tender.     Right Ear: Hearing, ear canal and external ear normal. No drainage. A middle ear effusion is present. There is no impacted cerumen. Tympanic membrane is bulging. Tympanic membrane is not injected or erythematous.     Left Ear: Hearing, ear canal and external ear normal. No drainage. A middle ear effusion is present. There is no impacted cerumen. Tympanic membrane is bulging. Tympanic membrane is not injected or erythematous.     Ears:     Comments: Bilateral EACs normal, both TMs bulging with clear fluid    Nose: Rhinorrhea present. No nasal deformity, septal deviation, signs of injury, nasal tenderness, mucosal edema or congestion. Rhinorrhea is clear.     Right Nostril: Occlusion present. No foreign body, epistaxis or septal hematoma.     Left Nostril: Occlusion present. No foreign body, epistaxis or septal hematoma.     Right Turbinates: Enlarged, swollen and pale.     Left Turbinates: Enlarged, swollen and pale.     Right Sinus: No maxillary sinus tenderness or frontal sinus tenderness.     Left Sinus: No maxillary sinus tenderness or frontal sinus tenderness.     Mouth/Throat:     Lips: Pink. No lesions.     Mouth: Mucous membranes are moist. No oral lesions.     Pharynx: Oropharynx is clear. Uvula midline. No posterior oropharyngeal erythema or uvula swelling.     Tonsils: No tonsillar exudate. 0 on the right. 0 on the left.     Comments: Postnasal drip Eyes:     General: Lids are normal.        Right eye: No discharge.        Left eye: No discharge.     Extraocular Movements: Extraocular movements intact.     Conjunctiva/sclera: Conjunctivae normal.     Right eye: Right conjunctiva is not injected.     Left eye: Left conjunctiva is not  injected.  Neck:     Trachea: Trachea and phonation normal. No tracheal tenderness.  Cardiovascular:     Rate and Rhythm: Normal rate and regular rhythm.  Pulses: Normal pulses.     Heart sounds: Normal heart sounds. No murmur heard.   No friction rub. No gallop.  Pulmonary:     Effort: Pulmonary effort is normal. No accessory muscle usage, prolonged expiration or respiratory distress.     Breath sounds: No stridor, decreased air movement or transmitted upper airway sounds. Examination of the right-upper field reveals wheezing. Examination of the left-upper field reveals wheezing. Examination of the right-lower field reveals rales. Examination of the left-lower field reveals rales. Wheezing and rales present. No decreased breath sounds or rhonchi.  Chest:     Chest wall: No tenderness.  Abdominal:     General: Abdomen is flat. Bowel sounds are normal.     Palpations: Abdomen is soft.     Tenderness: There is no abdominal tenderness.     Hernia: No hernia is present.  Musculoskeletal:        General: Normal range of motion.     Cervical back: Normal range of motion and neck supple. No edema, erythema, rigidity or crepitus. No pain with movement. Normal range of motion.     Right lower leg: No edema.     Left lower leg: No edema.  Lymphadenopathy:     Cervical: No cervical adenopathy.  Skin:    General: Skin is warm and dry.     Findings: No erythema or rash.     Comments: Skin is warm and dry to touch, good turgor, no rash appreciated  Neurological:     General: No focal deficit present.     Mental Status: She is alert and oriented to person, place, and time.     Motor: Motor function is intact.     Coordination: Coordination is intact.     Gait: Gait is intact.     Deep Tendon Reflexes:     Reflex Scores:      Patellar reflexes are 2+ on the right side and 2+ on the left side. Psychiatric:        Attention and Perception: Attention and perception normal.        Mood and  Affect: Mood normal.        Speech: Speech normal.        Behavior: Behavior normal. Behavior is cooperative.        Thought Content: Thought content normal.        Cognition and Memory: Cognition normal.        Judgment: Judgment normal.    Visual Acuity Right Eye Distance:   Left Eye Distance:   Bilateral Distance:    Right Eye Near:   Left Eye Near:    Bilateral Near:     UC Couse / Diagnostics / Procedures:    EKG  Radiology No results found.  Procedures Procedures (including critical care time)  UC Diagnoses / Final Clinical Impressions(s)   I have reviewed the triage vital signs and the nursing notes.  Pertinent labs & imaging results that were available during my care of the patient were reviewed by me and considered in my medical decision making (see chart for details).   Final diagnoses:  Upper respiratory tract infection, unspecified type  Bronchospasm with bronchitis, acute  Mild intermittent asthma with (acute) exacerbation  Viral URI with cough   Patient to receive an injection of Solu-Medrol in the office today, follow this with a Medrol Dosepak.  Patient advised to continue albuterol and Zyrtec at home.  Patient advised Robitussin without the DM should be continued as well,  Promethazine DM for sleep at night.  Patient will be provided with levofloxacin as a precaution given history of smoking and rales in bilateral lower lung fields.  Discussed possibility of patient requiring maintenance inhaler during winter months if this is a recurrent issue for her.  Believe the patient is currently suffering from an acute bronchitis secondary to mild intermittent asthma exacerbation.  ED Prescriptions     Medication Sig Dispense Auth. Provider   fluticasone (FLONASE) 50 MCG/ACT nasal spray Place 2 sprays into both nostrils daily. 18 mL Theadora RamaMorgan, Airis Barbee Scales, PA-C   methylPREDNISolone (MEDROL DOSEPAK) 4 MG TBPK tablet Take 24 mg on day 1, 20 mg on day 2, 16 mg on day  3, 12 mg on day 4, 8 mg on day 5, 4 mg on day 6. 21 tablet Theadora RamaMorgan, Tandy Grawe Scales, PA-C   guaifenesin (HUMIBID E) 400 MG TABS tablet Take 1 tablet 3 times daily as needed for chest congestion and cough 21 tablet Theadora RamaMorgan, Velvet Moomaw Scales, PA-C   levofloxacin (LEVAQUIN) 500 MG tablet Take 1 tablet (500 mg total) by mouth daily for 5 days. 5 tablet Theadora RamaMorgan, Stiven Kaspar Scales, PA-C   promethazine-dextromethorphan (PROMETHAZINE-DM) 6.25-15 MG/5ML syrup  (Status: Discontinued) Take 5 mLs by mouth 4 (four) times daily as needed for cough. 180 mL Theadora RamaMorgan, Harlym Gehling Scales, PA-C   promethazine-dextromethorphan (PROMETHAZINE-DM) 6.25-15 MG/5ML syrup Take 5 mLs by mouth 4 (four) times daily as needed for cough. 118 mL Theadora RamaMorgan, Estell Dillinger Scales, PA-C      PDMP not reviewed this encounter.  Pending results:  Labs Reviewed - No data to display  Medications Ordered in UC: Medications  methylPREDNISolone sodium succinate (SOLU-MEDROL) 125 mg/2 mL injection 80 mg (has no administration in time range)    Disposition Upon Discharge:  Condition: stable for discharge home Home: take medications as prescribed; routine discharge instructions as discussed; follow up as advised.  Patient presented with an acute illness with associated systemic symptoms and significant discomfort requiring urgent management. In my opinion, this is a condition that a prudent lay person (someone who possesses an average knowledge of health and medicine) may potentially expect to result in complications if not addressed urgently such as respiratory distress, impairment of bodily function or dysfunction of bodily organs.   Routine symptom specific, illness specific and/or disease specific instructions were discussed with the patient and/or caregiver at length.   As such, the patient has been evaluated and assessed, work-up was performed and treatment was provided in alignment with urgent care protocols and evidence based medicine.   Patient/parent/caregiver has been advised that the patient may require follow up for further testing and treatment if the symptoms continue in spite of treatment, as clinically indicated and appropriate.  If the patient was tested for COVID-19, Influenza and/or RSV, then the patient/parent/guardian was advised to isolate at home pending the results of his/her diagnostic coronavirus test and potentially longer if theyre positive. I have also advised pt that if his/her COVID-19 test returns positive, it's recommended to self-isolate for at least 10 days after symptoms first appeared AND until fever-free for 24 hours without fever reducer AND other symptoms have improved or resolved. Discussed self-isolation recommendations as well as instructions for household member/close contacts as per the [facility] and [place] DHHS, and also gave patient the COVID packet with this information.  Patient/parent/caregiver has been [facility] 3-5 days if no better; to PCP or the Emergency Department if new signs and symptoms develop, or if the current  signs or symptoms continue to change or worsen for further workup, evaluation and treatment as clinically indicated and appropriate  The patient will follow up with their current PCP if and as advised. If the patient does not currently have a PCP we will assist them in obtaining one.   The patient may need specialty follow up if the symptoms continue, in spite of conservative treatment and management, for further workup, evaluation, consultation and treatment as clinically indicated and appropriate.  Patient/parent/caregiver verbalized understanding and agreement of plan as discussed.  All questions were addressed during visit.  Please see discharge instructions below for further details of plan.  Discharge Instructions:   Discharge Instructions      Your symptoms and physical exam findings are concerning for a viral respiratory infection.  Based on my  physical exam findings, I believe you are suffering from bronchitis secondary to acute asthma exacerbation as a result.  Conservative care is recommended at this time.  This includes rest, pushing clear fluids and activity as tolerated.  Warm beverages such as teas and broths versus cold beverages/popsicles and frozen sherbet/sorbet are your choice, both warm and cold are beneficial.  You may also notice that your appetite is reduced; this is okay as long as you are drinking plenty of clear fluids.    Please see the list below for recommended medications, dosages and frequencies to provide relief of your current symptoms:     Methylprednisolone IM (Solu-Medrol):  To quickly address your significant respiratory inflammation, you were provided with an injection of methylprednisolone in the office today.  You should continue to feel the full benefit of the steroid for the next 4 to 6 hours.    Methylprednisolone (Medrol Dosepak): This is a steroid that will significantly calm your upper and lower airways, please take one row of tablets daily with your breakfast meal starting tomorrow morning until the prescription is complete.      Albuterol HFA: This is a bronchodilator, it relaxes the smooth muscles that constrict your airway in your lungs when you are feeling sick or having inflammation secondary to allergies or upper respiratory infection.  Please inhale 2 puffs twice daily every day using the spacer provided.  You can also inhale 2 more puffs as often as needed throughout the day for aggravating cough, chest tightness, feeling short of breath, wheezing.      Fluticasone (Flonase): This is a steroid nasal spray that you use once daily, 1 spray in each nare.  After 3 to 5 days of use, you will have significant improvement of the inflammation and mucus production that is being caused by exposure to allergens.  This medication can be purchased over-the-counter however I have provided you with a  prescription.      Cetirizine (Zyrtec): This is an excellent second-generation antihistamine that helps to reduce respiratory inflammatory response to viruses and environmental allergens.  Please take 1 tablet daily at bedtime.   Guaifenesin (Robitussin, Mucinex): This is an expectorant.  This helps break up chest congestion and loosen up thick nasal drainage making phlegm and drainage more liquid and therefore easier to remove.  I recommend being 400 mg three times daily as needed.  I have provided you with a prescription of this medication.   Promethazine DM: Promethazine is both a nasal decongestant and an antinausea medication that makes most patients feel fairly sleepy.  The DM is dextromethorphan, a cough suppressant found in many over-the-counter cough medications.  Please take 5 mL before bedtime to  minimize your cough which will help you sleep better.  I have provided you with a prescription for this medication.  When I sent this prescription, I received yellow flag that your insurance may not cover this.  I provided you with a coupon which should give you a price of $10.65 for 118 mL.   Please follow-up within the next 3 to 5 days either with your primary care provider or urgent care if your symptoms do not resolve.  If you do not have a primary care provider, we will assist you in finding one.       This office note has been dictated using Teaching laboratory technician.  Unfortunately, and despite my best efforts, this method of dictation can sometimes lead to occasional typographical or grammatical errors.  I apologize in advance if this occurs.     Theadora Rama Scales, PA-C 07/22/21 1257

## 2021-07-22 NOTE — Discharge Instructions (Addendum)
Your symptoms and physical exam findings are concerning for a viral respiratory infection.  Based on my physical exam findings, I believe you are suffering from bronchitis secondary to acute asthma exacerbation as a result.  Conservative care is recommended at this time.  This includes rest, pushing clear fluids and activity as tolerated.  Warm beverages such as teas and broths versus cold beverages/popsicles and frozen sherbet/sorbet are your choice, both warm and cold are beneficial.  You may also notice that your appetite is reduced; this is okay as long as you are drinking plenty of clear fluids.    Please see the list below for recommended medications, dosages and frequencies to provide relief of your current symptoms:     Methylprednisolone IM (Solu-Medrol):  To quickly address your significant respiratory inflammation, you were provided with an injection of methylprednisolone in the office today.  You should continue to feel the full benefit of the steroid for the next 4 to 6 hours.    Methylprednisolone (Medrol Dosepak): This is a steroid that will significantly calm your upper and lower airways, please take one row of tablets daily with your breakfast meal starting tomorrow morning until the prescription is complete.      Albuterol HFA: This is a bronchodilator, it relaxes the smooth muscles that constrict your airway in your lungs when you are feeling sick or having inflammation secondary to allergies or upper respiratory infection.  Please inhale 2 puffs twice daily every day using the spacer provided.  You can also inhale 2 more puffs as often as needed throughout the day for aggravating cough, chest tightness, feeling short of breath, wheezing.      Fluticasone (Flonase): This is a steroid nasal spray that you use once daily, 1 spray in each nare.  After 3 to 5 days of use, you will have significant improvement of the inflammation and mucus production that is being caused by exposure to  allergens.  This medication can be purchased over-the-counter however I have provided you with a prescription.      Cetirizine (Zyrtec): This is an excellent second-generation antihistamine that helps to reduce respiratory inflammatory response to viruses and environmental allergens.  Please take 1 tablet daily at bedtime.   Guaifenesin (Robitussin, Mucinex): This is an expectorant.  This helps break up chest congestion and loosen up thick nasal drainage making phlegm and drainage more liquid and therefore easier to remove.  I recommend being 400 mg three times daily as needed.  I have provided you with a prescription of this medication.   Promethazine DM: Promethazine is both a nasal decongestant and an antinausea medication that makes most patients feel fairly sleepy.  The DM is dextromethorphan, a cough suppressant found in many over-the-counter cough medications.  Please take 5 mL before bedtime to minimize your cough which will help you sleep better.  I have provided you with a prescription for this medication.  When I sent this prescription, I received yellow flag that your insurance may not cover this.  I provided you with a coupon which should give you a price of $10.65 for 118 mL.   Please follow-up within the next 3 to 5 days either with your primary care provider or urgent care if your symptoms do not resolve.  If you do not have a primary care provider, we will assist you in finding one.

## 2021-10-05 DIAGNOSIS — G8929 Other chronic pain: Secondary | ICD-10-CM | POA: Insufficient documentation

## 2022-02-28 DIAGNOSIS — I6522 Occlusion and stenosis of left carotid artery: Secondary | ICD-10-CM | POA: Insufficient documentation

## 2022-02-28 DIAGNOSIS — G8929 Other chronic pain: Secondary | ICD-10-CM | POA: Insufficient documentation

## 2022-09-03 DIAGNOSIS — E559 Vitamin D deficiency, unspecified: Secondary | ICD-10-CM | POA: Diagnosis not present

## 2022-09-03 DIAGNOSIS — I10 Essential (primary) hypertension: Secondary | ICD-10-CM | POA: Diagnosis not present

## 2022-09-03 DIAGNOSIS — E7800 Pure hypercholesterolemia, unspecified: Secondary | ICD-10-CM | POA: Diagnosis not present

## 2022-09-03 DIAGNOSIS — R5381 Other malaise: Secondary | ICD-10-CM | POA: Diagnosis not present

## 2022-09-05 DIAGNOSIS — I10 Essential (primary) hypertension: Secondary | ICD-10-CM | POA: Diagnosis not present

## 2022-09-05 DIAGNOSIS — J4522 Mild intermittent asthma with status asthmaticus: Secondary | ICD-10-CM | POA: Diagnosis not present

## 2022-09-05 DIAGNOSIS — I6522 Occlusion and stenosis of left carotid artery: Secondary | ICD-10-CM | POA: Diagnosis not present

## 2022-09-05 DIAGNOSIS — E559 Vitamin D deficiency, unspecified: Secondary | ICD-10-CM | POA: Insufficient documentation

## 2022-09-05 DIAGNOSIS — F411 Generalized anxiety disorder: Secondary | ICD-10-CM | POA: Diagnosis not present

## 2022-10-17 ENCOUNTER — Ambulatory Visit
Admission: RE | Admit: 2022-10-17 | Discharge: 2022-10-17 | Disposition: A | Discharge status: Home / Self Care | Payer: BC Managed Care – PPO | Source: Ambulatory Visit | Attending: Family Medicine | Admitting: Family Medicine

## 2022-10-17 VITALS — BP 126/81 | HR 95 | Temp 97.9°F | Resp 18

## 2022-10-17 DIAGNOSIS — M5442 Lumbago with sciatica, left side: Secondary | ICD-10-CM | POA: Diagnosis not present

## 2022-10-17 DIAGNOSIS — M5441 Lumbago with sciatica, right side: Secondary | ICD-10-CM

## 2022-10-17 MED ORDER — HYDROCODONE-ACETAMINOPHEN 5-325 MG PO TABS: 1.0000 | ORAL_TABLET | Freq: Four times a day (QID) | ORAL | 0 refills | Status: DC | PRN

## 2022-10-17 MED ORDER — METHYLPREDNISOLONE 4 MG PO TBPK: ORAL_TABLET | ORAL | 0 refills | Status: DC

## 2022-10-17 NOTE — Discharge Instructions (Addendum)
Take the Medrol Dosepak as instructed in the package.  Hydrocodone 5 mg--1 tablet every 6 hours as needed for pain.  This is best taken with food.  It can cause sleepiness or dizziness  Please follow-up with your primary care

## 2022-10-17 NOTE — ED Provider Notes (Signed)
[name]    CSN: [id number] Arrival date & time: 10/17/22  1052      History   Chief Complaint Chief Complaint  Patient presents with   Back Pain    HPI Paige Woods is a 62 y.o. female.    Back Pain  Here for low back pain that is burning and radiating into her right leg.  This started as a low level type trouble like her usual sciatica about a week ago, and now it is worsened in the last day.  It also now is bothering her on her left side and into her left leg.  No new trauma or fall.  No fever or chills and no hematuria or dysuria.  No bowel or bladder incontinence.  She has been taking some of her Flexeril she has at home and it is not helping much.  Past medical history includes some reduced kidney function, as her last EGFR in care everywhere is in the 64s.  She does not have diabetes  She takes lorazepam 1/2 tablet every evening.  Past Medical History:  Diagnosis Date   Acute medial meniscal tear    left   Anxiety    Arthritis    Complication of anesthesia    PONV   Depression    Hypertension     There are no problems to display for this patient.   Past Surgical History:  Procedure Laterality Date   ANKLE SURGERY Right    CESAREAN SECTION     CHOLECYSTECTOMY     CHONDROPLASTY Left 11/16/2014   Procedure: CHONDROPLASTY;  Surgeon: Gean Birchwood, MD;  Location: [facility];  Service: Orthopedics;  Laterality: Left;   KNEE ARTHROSCOPY WITH DRILLING/MICROFRACTURE Left 11/16/2014   Procedure: KNEE ARTHROSCOPY WITH DRILLING/MICROFRACTURE;  Surgeon: Gean Birchwood, MD;  Location: [facility];  Service: Orthopedics;  Laterality: Left;   KNEE SURGERY Left    SHOULDER ARTHROSCOPY Left     OB History   No obstetric history on file.      Home Medications    Prior to Admission medications   Medication Sig Start Date End Date Taking? Authorizing Provider  HYDROcodone-acetaminophen (NORCO/VICODIN) 5-325 MG tablet  Take 1 tablet by mouth every 6 (six) hours as needed (pain). 10/17/22  Yes Yoel Kaufhold, Janace Aris, MD  LORazepam (ATIVAN) 1 MG tablet Take 1 tablet by mouth 2 (two) times daily as needed for anxiety. 05/28/22  Yes [provider]  methylPREDNISolone (MEDROL DOSEPAK) 4 MG TBPK tablet Take as per package instructions 10/17/22  Yes Zenia Resides, MD  rosuvastatin (CRESTOR) 40 MG tablet Take 40 mg by mouth daily. 09/04/21  Yes [provider]  amLODipine (NORVASC) 2.5 MG tablet Take 5 mg by mouth daily.    [provider]  aspirin 81 MG EC tablet Take by mouth.    [provider]  citalopram (CELEXA) 40 MG tablet Take 40 mg by mouth daily.    [provider]  lisinopril-hydrochlorothiazide (PRINZIDE,ZESTORETIC) 10-12.5 MG per tablet Take 1 tablet by mouth daily.  06/22/20  [provider]    Family History Family History  Problem Relation Age of Onset   Diabetes Mother    CAD Mother     Social History Social History   Tobacco Use   Smoking status: Every Day    Types: Cigarettes    Last attempt to quit: 10/12/2014    Years since quitting: 8.0   Smokeless tobacco: Never  Vaping Use  Vaping Use: Never used  Substance Use Topics   Alcohol use: No   Drug use: No     Allergies   Ketoprofen and Tramadol   Review of Systems Review of Systems  Musculoskeletal:  Positive for back pain.     Physical Exam Triage Vital Signs ED Triage Vitals [10/17/22 1116]  Enc Vitals Group     BP 126/81     Pulse Rate 95     Resp 18     Temp 97.9 F (36.6 C)     Temp Source Oral     SpO2      Weight      Height      Head Circumference      Peak Flow      Pain Score 8     Pain Loc      Pain Edu?      Excl. in GC?    No data found.  Updated Vital Signs BP 126/81 (BP Location: Left Arm)   Pulse 95   Temp 97.9 F (36.6 C) (Oral)   Resp 18   Visual Acuity Right Eye Distance:   Left Eye Distance:   Bilateral Distance:    Right  Eye Near:   Left Eye Near:    Bilateral Near:     Physical Exam Vitals reviewed.  Constitutional:      Appearance: She is not ill-appearing, toxic-appearing or diaphoretic.     Comments: In obvious discomfort in the exam room  Cardiovascular:     Rate and Rhythm: Normal rate and regular rhythm.     Heart sounds: No murmur heard. Pulmonary:     Effort: Pulmonary effort is normal. No respiratory distress.     Breath sounds: Normal breath sounds. No stridor. No wheezing, rhonchi or rales.  Musculoskeletal:     Cervical back: Neck supple.     Comments: There is tenderness of the lumbosacral area bilaterally, and straight leg raise increases the pain in her back.  Skin:    Coloration: Skin is not jaundiced or pale.  Neurological:     General: No focal deficit present.     Mental Status: She is alert and oriented to person, place, and time.  Psychiatric:        Behavior: Behavior normal.      UC Treatments / Results  Labs (all labs ordered are listed, but only abnormal results are displayed) Labs Reviewed - No data to display  EKG   Radiology No results found.  Procedures Procedures (including critical care time)  Medications Ordered in UC Medications - No data to display  Initial Impression / Assessment and Plan / UC Course  I have reviewed the triage vital signs and the nursing notes.  Pertinent labs & imaging results that were available during my care of the patient were reviewed by me and considered in my medical decision making (see chart for details).        Medrol Dosepak is sent in for inflammation.  She already has Flexeril at home.  Hydrocodone is sent in for pain, small quantity.  I have asked her to follow-up with her primary care Final Clinical Impressions(s) / UC Diagnoses   Final diagnoses:  Acute bilateral low back pain with bilateral sciatica     Discharge Instructions      Take the Medrol Dosepak as instructed in the  package.  Hydrocodone 5 mg--1 tablet every 6 hours as needed for pain.  This is best taken with food.  It can cause sleepiness or dizziness  Please follow-up with your primary care     ED Prescriptions     Medication Sig Dispense Auth. Provider   methylPREDNISolone (MEDROL DOSEPAK) 4 MG TBPK tablet Take as per package instructions 1 each Marlinda Mike Janace Aris, MD   HYDROcodone-acetaminophen (NORCO/VICODIN) 5-325 MG tablet Take 1 tablet by mouth every 6 (six) hours as needed (pain). 12 tablet Gloria Lambertson, Janace Aris, MD      I have reviewed the PDMP during this encounter.   Zenia Resides, MD 10/17/22 1131

## 2022-10-17 NOTE — ED Triage Notes (Signed)
Patient with c/o burning right lower back pain. States it radiates down her right leg all the way into her toes. Patient denies injury and denies urinary sx.

## 2023-01-09 ENCOUNTER — Ambulatory Visit
Admission: EM | Admit: 2023-01-09 | Discharge: 2023-01-09 | Disposition: A | Discharge status: Home / Self Care | Payer: PRIVATE HEALTH INSURANCE | Attending: Family Medicine | Admitting: Family Medicine

## 2023-01-09 DIAGNOSIS — S46211A Strain of muscle, fascia and tendon of other parts of biceps, right arm, initial encounter: Secondary | ICD-10-CM | POA: Diagnosis not present

## 2023-01-09 MED ORDER — HYDROCODONE-ACETAMINOPHEN 5-325 MG PO TABS: 1.0000 | ORAL_TABLET | Freq: Every evening | ORAL | 0 refills | Status: DC | PRN

## 2023-01-09 MED ORDER — DEXAMETHASONE SODIUM PHOSPHATE 10 MG/ML IJ SOLN
10.0000 mg | Freq: Once | INTRAMUSCULAR | Status: AC
  Administered 2023-01-09: 10 mg via INTRAMUSCULAR

## 2023-01-09 NOTE — ED Triage Notes (Signed)
Pt presents with c/o rt upper arm pain that radiates to the rt shoulder.   States she has taken aleve, tylenol no releif

## 2023-01-09 NOTE — Discharge Instructions (Addendum)
Be aware, you have been prescribed pain medications that may cause drowsiness. While taking this medication, do not take any other medications containing acetaminophen (Tylenol). Do not combine with alcohol or recreational drugs. Please do not drive, operate heavy machinery, or take part in activities that require making important decisions while on this medication as your judgement may be clouded.  Meds ordered this encounter  Medications   dexamethasone (DECADRON) injection 10 mg   HYDROcodone-acetaminophen (NORCO/VICODIN) 5-325 MG tablet    Sig: Take 1 tablet by mouth at bedtime as needed for moderate pain or severe pain.    Dispense:  4 tablet    Refill:  0

## 2023-01-11 NOTE — ED Provider Notes (Signed)
[facility]   [id number] 01/09/23 Arrival Time: 1600  ASSESSMENT & PLAN:  1. Biceps strain, right, initial encounter    No indication for plain imaging today. Suspect R biceps strain. OTC Aleve at bedtime. ROM as tolerated.  Meds ordered this encounter  Medications   dexamethasone (DECADRON) injection 10 mg   HYDROcodone-acetaminophen (NORCO/VICODIN) 5-325 MG tablet    Sig: Take 1 tablet by mouth at bedtime as needed for moderate pain or severe pain.    Dispense:  4 tablet    Refill:  0   Declines work note.  Recommend:  Follow-up Information     [facility].   Why: If worsening or failing to improve as anticipated. Contact information: [address] [place] [address] [phone]                [place] Controlled Substances Registry consulted for this patient. I feel the risk/benefit ratio today is favorable for proceeding with this prescription for a controlled substance. Medication sedation precautions given.  Reviewed expectations re: course of current medical issues. Questions answered. Outlined signs and symptoms indicating need for more acute intervention. Patient verbalized understanding. After Visit Summary given.  SUBJECTIVE: History from: patient. Paige Woods is a 62 y.o. female who reports R upper arm pain that radiates to the R shoulder. Couple of days. Very painful at night; affecting sleep. Does lift a lot at work. Denies trauma. Aleve with just a little relief. No extremity sensation changes or weakness.   Past Surgical History:  Procedure Laterality Date   ANKLE SURGERY Right    CESAREAN SECTION     CHOLECYSTECTOMY     CHONDROPLASTY Left 11/16/2014   Procedure: CHONDROPLASTY;  Surgeon: Gean Birchwood, MD;  Location: [facility];  Service: Orthopedics;  Laterality: Left;   KNEE ARTHROSCOPY WITH DRILLING/MICROFRACTURE Left 11/16/2014   Procedure: KNEE ARTHROSCOPY WITH  DRILLING/MICROFRACTURE;  Surgeon: Gean Birchwood, MD;  Location: [facility];  Service: Orthopedics;  Laterality: Left;   KNEE SURGERY Left    SHOULDER ARTHROSCOPY Left       OBJECTIVE:  Vitals:   01/09/23 1614  BP: 126/85  Pulse: 94  Resp: 18  Temp: 98 F (36.7 C)  TempSrc: Oral  SpO2: 96%    General appearance: alert; no distress HEENT: [place]; AT Neck: supple with FROM Resp: unlabored respirations Extremities: RUE: warm with well perfused appearance; well localized significant tenderness over right distal biceps muscle near insertion; without gross deformities; swelling: none; bruising: none; shoulder and elbow ROM: normal CV: brisk extremity capillary refill of RUE; 2+ radial pulse of RUE. Skin: warm and dry; no visible rashes Neurologic: gait normal; normal sensation and strength of RUE Psychological: alert and cooperative; normal mood and affect     Allergies  Allergen Reactions   Ketoprofen Nausea And Vomiting   Tramadol Nausea And Vomiting    Past Medical History:  Diagnosis Date   Acute medial meniscal tear    left   Anxiety    Arthritis    Complication of anesthesia    PONV   Depression    Hypertension    Social History   Socioeconomic History   Marital status: Divorced    Spouse name: Not on file   Number of children: Not on file   Years of education: Not on file   Highest education level: Not on file  Occupational History   Not on file  Tobacco Use   Smoking  status: Every Day    Current packs/day: 0.00    Types: Cigarettes    Last attempt to quit: 10/12/2014    Years since quitting: 8.2   Smokeless tobacco: Never  Vaping Use   Vaping status: Never Used  Substance and Sexual Activity   Alcohol use: No   Drug use: No   Sexual activity: Yes  Other Topics Concern   Not on file  Social History Narrative   Not on file   Social Determinants of Health   Financial Resource Strain: Not on file  Food Insecurity: Medium Risk  (08/28/2022)   [facility]   Food vital sign    Within the past 12 months, you worried that your food would run out before you got money to buy more: Sometimes true    Within the past 12 months, the food you bought just didn't last and you didn't have money to get more. : Sometimes true  Transportation Needs: Not on file (08/28/2022)  Physical Activity: Not on file  Stress: Not on file  Social Connections: Not on file   Family History  Problem Relation Age of Onset   Diabetes Mother    CAD Mother    Past Surgical History:  Procedure Laterality Date   ANKLE SURGERY Right    CESAREAN SECTION     CHOLECYSTECTOMY     CHONDROPLASTY Left 11/16/2014   Procedure: CHONDROPLASTY;  Surgeon: Gean Birchwood, MD;  Location: [facility];  Service: Orthopedics;  Laterality: Left;   KNEE ARTHROSCOPY WITH DRILLING/MICROFRACTURE Left 11/16/2014   Procedure: KNEE ARTHROSCOPY WITH DRILLING/MICROFRACTURE;  Surgeon: Gean Birchwood, MD;  [facility];  Service: Orthopedics;  Laterality: Left;   KNEE SURGERY Left    SHOULDER ARTHROSCOPY Left        Mardella Layman, MD 01/11/23 1026

## 2023-01-16 ENCOUNTER — Encounter: Payer: Self-pay | Admitting: Family Medicine

## 2023-01-16 ENCOUNTER — Ambulatory Visit (INDEPENDENT_AMBULATORY_CARE_PROVIDER_SITE_OTHER): Payer: PRIVATE HEALTH INSURANCE | Admitting: Family Medicine

## 2023-01-16 VITALS — BP 131/83 | HR 96 | Temp 97.7°F | Resp 16 | Ht 74.0 in | Wt 214.6 lb

## 2023-01-16 DIAGNOSIS — E785 Hyperlipidemia, unspecified: Secondary | ICD-10-CM

## 2023-01-16 DIAGNOSIS — I10 Essential (primary) hypertension: Secondary | ICD-10-CM | POA: Diagnosis not present

## 2023-01-16 DIAGNOSIS — F411 Generalized anxiety disorder: Secondary | ICD-10-CM

## 2023-01-16 DIAGNOSIS — G4700 Insomnia, unspecified: Secondary | ICD-10-CM | POA: Diagnosis not present

## 2023-01-16 DIAGNOSIS — Z7689 Persons encountering health services in other specified circumstances: Secondary | ICD-10-CM

## 2023-01-16 MED ORDER — AMLODIPINE BESYLATE 2.5 MG PO TABS: 5.0000 mg | ORAL_TABLET | Freq: Every day | ORAL | 0 refills | Status: DC

## 2023-01-16 MED ORDER — ROSUVASTATIN CALCIUM 40 MG PO TABS: 40.0000 mg | ORAL_TABLET | Freq: Every day | ORAL | 0 refills | Status: DC

## 2023-01-16 MED ORDER — CLONAZEPAM 0.5 MG PO TABS: 0.5000 mg | ORAL_TABLET | Freq: Two times a day (BID) | ORAL | 0 refills | Status: DC | PRN

## 2023-01-16 MED ORDER — CITALOPRAM HYDROBROMIDE 40 MG PO TABS: 40.0000 mg | ORAL_TABLET | Freq: Every day | ORAL | 0 refills | Status: DC

## 2023-01-16 NOTE — Progress Notes (Signed)
Patient is here to established care with provider today. Patient has many health concern they would like to discuss with provider today  Care gaps discuss at appointment today  

## 2023-01-17 ENCOUNTER — Encounter: Payer: Self-pay | Admitting: Family Medicine

## 2023-01-17 NOTE — Progress Notes (Signed)
Established Patient Office Visit  Subjective    Patient ID: Paige Woods, female    DOB: [birth year]  Age: 62 y.o. MRN: [id number]  CC:  Chief Complaint  Patient presents with   Establish Care    HPI Paige Woods presents to establish care and for review of chronic med issues. Patient denies acute complaints.    Outpatient Encounter Medications as of 01/16/2023  Medication Sig   clonazePAM (KLONOPIN) 0.5 MG tablet Take 1 tablet (0.5 mg total) by mouth 2 (two) times daily as needed for anxiety.   cyclobenzaprine (FLEXERIL) 10 MG tablet TAKE 1 TABLET(10 MG) BY MOUTH THREE TIMES DAILY AS NEEDED FOR MUSCLE SPASMS   amLODipine (NORVASC) 2.5 MG tablet Take 2 tablets (5 mg total) by mouth daily.   aspirin 81 MG EC tablet Take by mouth.   citalopram (CELEXA) 40 MG tablet Take 1 tablet (40 mg total) by mouth daily.   LORazepam (ATIVAN) 1 MG tablet Take 1 tablet by mouth 2 (two) times daily as needed for anxiety.   rosuvastatin (CRESTOR) 40 MG tablet Take 1 tablet (40 mg total) by mouth daily.   [DISCONTINUED] amLODipine (NORVASC) 2.5 MG tablet Take 5 mg by mouth daily.   [DISCONTINUED] citalopram (CELEXA) 40 MG tablet Take 40 mg by mouth daily.   [DISCONTINUED] HYDROcodone-acetaminophen (NORCO/VICODIN) 5-325 MG tablet Take 1 tablet by mouth at bedtime as needed for moderate pain or severe pain.   [DISCONTINUED] lisinopril-hydrochlorothiazide (PRINZIDE,ZESTORETIC) 10-12.5 MG per tablet Take 1 tablet by mouth daily.   [DISCONTINUED] methylPREDNISolone (MEDROL DOSEPAK) 4 MG TBPK tablet Take as per package instructions   [DISCONTINUED] rosuvastatin (CRESTOR) 40 MG tablet Take 40 mg by mouth daily.   No facility-administered encounter medications on file as of 01/16/2023.    Past Medical History:  Diagnosis Date   Acute medial meniscal tear    left   Anxiety    Arthritis    Complication of anesthesia    PONV   Depression    Hypertension     Past Surgical History:  Procedure  Laterality Date   ANKLE SURGERY Right    CESAREAN SECTION     CHOLECYSTECTOMY     CHONDROPLASTY Left 11/16/2014   Procedure: CHONDROPLASTY;  Surgeon: Gean Birchwood, MD;  Location: [facility];  Service: Orthopedics;  Laterality: Left;   KNEE ARTHROSCOPY WITH DRILLING/MICROFRACTURE Left 11/16/2014   Procedure: KNEE ARTHROSCOPY WITH DRILLING/MICROFRACTURE;  Surgeon: Gean Birchwood, MD;  Location: [facility];  Service: Orthopedics;  Laterality: Left;   KNEE SURGERY Left    SHOULDER ARTHROSCOPY Left     Family History  Problem Relation Age of Onset   Diabetes Mother    CAD Mother     Social History   Socioeconomic History   Marital status: Divorced    Spouse name: Not on file   Number of children: Not on file   Years of education: Not on file   Highest education level: Not on file  Occupational History   Not on file  Tobacco Use   Smoking status: Every Day    Current packs/day: 0.00    Types: Cigarettes    Last attempt to quit: 10/12/2014    Years since quitting: 8.2   Smokeless tobacco: Never  Vaping Use   Vaping status: Never Used  Substance and Sexual Activity   Alcohol use: No   Drug use: No   Sexual activity: Yes  Other Topics Concern   Not on file  Social History  Narrative   Not on file   Social Determinants of Health   Financial Resource Strain: Not on file  Food Insecurity: Medium Risk (08/28/2022)   [facility]   Food vital sign    Within the past 12 months, you worried that your food would run out before you got money to buy more: Sometimes true    Within the past 12 months, the food you bought just didn't last and you didn't have money to get more. : Sometimes true  Transportation Needs: Not on file (08/28/2022)  Physical Activity: Not on file  Stress: Not on file  Social Connections: Not on file  Intimate Partner Violence: Not on file    Review of Systems  All other systems reviewed and are  negative.       Objective    BP 131/83   Pulse 96   Temp 97.7 F (36.5 C) (Tympanic)   Resp 16   Ht 6\' 2"  (1.88 m)   Wt 214 lb 9.6 oz (97.3 kg)   SpO2 96%   BMI 27.55 kg/m   Physical Exam Vitals and nursing note reviewed.  Constitutional:      General: She is not in acute distress. Cardiovascular:     Rate and Rhythm: Normal rate and regular rhythm.  Pulmonary:     Effort: Pulmonary effort is normal.     Breath sounds: Normal breath sounds.  Abdominal:     Palpations: Abdomen is soft.     Tenderness: There is no abdominal tenderness.  Neurological:     General: No focal deficit present.     Mental Status: She is alert and oriented to person, place, and time.         Assessment & Plan:   1. Hypertension, benign Appears stable. Amlodipine refilled.   2. Hyperlipidemia, unspecified hyperlipidemia type Continue. Crestor refilled.   3. Anxiety state Celexa refilled. Discussed in detail ativan changed to clonazepam for PRN usage only.   4. Insomnia, unspecified type Discussed sleep hygiene.   5. Encounter to establish care     Return in about 2 months (around 03/19/2023).   Tommie Raymond, MD

## 2023-02-16 ENCOUNTER — Ambulatory Visit
Admission: EM | Admit: 2023-02-16 | Discharge: 2023-02-16 | Disposition: A | Discharge status: Home / Self Care | Payer: PRIVATE HEALTH INSURANCE | Attending: Physician Assistant | Admitting: Physician Assistant

## 2023-02-16 DIAGNOSIS — S46211D Strain of muscle, fascia and tendon of other parts of biceps, right arm, subsequent encounter: Secondary | ICD-10-CM | POA: Diagnosis not present

## 2023-02-16 DIAGNOSIS — M62838 Other muscle spasm: Secondary | ICD-10-CM

## 2023-02-16 MED ORDER — CYCLOBENZAPRINE HCL 10 MG PO TABS
10.0000 mg | ORAL_TABLET | Freq: Two times a day (BID) | ORAL | 0 refills | Status: AC | PRN
Start: 2023-02-16 — End: ?

## 2023-02-16 NOTE — ED Provider Notes (Signed)
[name]    CSN: [id number] Arrival date & time: 02/16/23  1049      History   Chief Complaint Chief Complaint  Patient presents with   Arm Pain    HPI Paige Woods is a 62 y.o. female.   Patient presents with continued right elbow pain.  With same about 1 month ago here for similar problem.  She reports initial improvement with decreased activity and Aleve.  Pain has become worse recently.  Denies new injury or trauma.  She reports increased pain with pronation and supination.  She reports she is now experiencing cervical neck muscle spasm.  Denies numbness, tingling, weakness.  She reports she is right-handed.  Has to turn patient is while at work which is irritating to the arm.  She cannot afford orthopedics at this time.    Past Medical History:  Diagnosis Date   Acute medial meniscal tear    left   Anxiety    Arthritis    Complication of anesthesia    PONV   Depression    Hypertension     Patient Active Problem List   Diagnosis Date Noted   Vitamin D deficiency 09/05/2022   Chronic left-sided low back pain with sciatica 02/28/2022   Stenosis of left carotid artery 02/28/2022   Chronic knee pain after total replacement of right knee joint 10/05/2021   Muscle spasm of back 05/11/2019   Sciatica of left side 05/11/2019   Malaise 07/11/2017   Precordial pain 07/11/2017   Pure hypercholesterolemia 07/11/2017   CKD (chronic kidney disease) stage 3, GFR 30-59 ml/min (HCC) 11/21/2016   Risk for falls 04/12/2016   Anxiety state 10/11/2013   Hyperlipidemia 10/11/2013   Hypertension, benign 10/11/2013   Obstructive chronic bronchitis 04/22/2013   Breast calcification seen on mammogram 03/31/2013   Tobacco use disorder 03/31/2013   Depressive disorder 12/05/2012   Insomnia 12/05/2012   Mild intermittent asthma with status asthmaticus 12/05/2012   Obesity 12/05/2012   Periodic headache syndrome, not intractable 12/05/2012    Past Surgical  History:  Procedure Laterality Date   ANKLE SURGERY Right    CESAREAN SECTION     CHOLECYSTECTOMY     CHONDROPLASTY Left 11/16/2014   Procedure: CHONDROPLASTY;  Surgeon: Gean Birchwood, MD;  Location: [facility];  Service: Orthopedics;  Laterality: Left;   KNEE ARTHROSCOPY WITH DRILLING/MICROFRACTURE Left 11/16/2014   Procedure: KNEE ARTHROSCOPY WITH DRILLING/MICROFRACTURE;  Surgeon: Gean Birchwood, MD;  Location: [facility];  Service: Orthopedics;  Laterality: Left;   KNEE SURGERY Left    SHOULDER ARTHROSCOPY Left     OB History   No obstetric history on file.      Home Medications    Prior to Admission medications   Medication Sig Start Date End Date Taking? Authorizing Provider  amLODipine (NORVASC) 2.5 MG tablet Take 2 tablets (5 mg total) by mouth daily. 01/16/23  Yes Georganna Skeans, MD  aspirin 81 MG EC tablet Take by mouth.   Yes [provider]  citalopram (CELEXA) 40 MG tablet Take 1 tablet (40 mg total) by mouth daily. 01/16/23  Yes Georganna Skeans, MD  clonazePAM (KLONOPIN) 0.5 MG tablet Take 1 tablet (0.5 mg total) by mouth 2 (two) times daily as needed for anxiety. 01/16/23  Yes Georganna Skeans, MD  cyclobenzaprine (FLEXERIL) 10 MG tablet Take 1 tablet (10 mg total) by mouth 2 (two) times daily as needed for muscle spasms. 02/16/23  Yes Ward, Tylene Fantasia, PA-C  rosuvastatin (CRESTOR)  40 MG tablet Take 1 tablet (40 mg total) by mouth daily. 01/16/23  Yes Georganna Skeans, MD  LORazepam (ATIVAN) 1 MG tablet Take 1 tablet by mouth 2 (two) times daily as needed for anxiety. 05/28/22   [provider]  lisinopril-hydrochlorothiazide (PRINZIDE,ZESTORETIC) 10-12.5 MG per tablet Take 1 tablet by mouth daily.  06/22/20  [provider]    Family History Family History  Problem Relation Age of Onset   Diabetes Mother    CAD Mother     Social History Social History   Tobacco Use   Smoking status: Every Day    Current packs/day: 0.00     Types: Cigarettes    Last attempt to quit: 10/12/2014    Years since quitting: 8.3   Smokeless tobacco: Never  Vaping Use   Vaping status: Never Used  Substance Use Topics   Alcohol use: No   Drug use: No     Allergies   Tramadol and Ketoprofen   Review of Systems Review of Systems  Constitutional:  Negative for chills and fever.  HENT:  Negative for ear pain and sore throat.   Eyes:  Negative for pain and visual disturbance.  Respiratory:  Negative for cough and shortness of breath.   Cardiovascular:  Negative for chest pain and palpitations.  Gastrointestinal:  Negative for abdominal pain and vomiting.  Genitourinary:  Negative for dysuria and hematuria.  Musculoskeletal:  Positive for myalgias. Negative for arthralgias and back pain.  Skin:  Negative for color change and rash.  Neurological:  Negative for seizures and syncope.  All other systems reviewed and are negative.    Physical Exam Triage Vital Signs ED Triage Vitals  Encounter Vitals Group     BP 02/16/23 1112 130/76     Systolic BP Percentile --      Diastolic BP Percentile --      Pulse Rate 02/16/23 1112 (!) 102     Resp 02/16/23 1112 18     Temp 02/16/23 1112 (!) 97.5 F (36.4 C)     Temp Source 02/16/23 1112 Oral     SpO2 02/16/23 1112 96 %     Weight 02/16/23 1110 220 lb (99.8 kg)     Height 02/16/23 1110 6\' 2"  (1.88 m)     Head Circumference --      Peak Flow --      Pain Score 02/16/23 1100 4     Pain Loc --      Pain Education --      Exclude from Growth Chart --    No data found.  Updated Vital Signs BP 130/76 (BP Location: Left Arm)   Pulse (!) 102   Temp (!) 97.5 F (36.4 C) (Oral)   Resp 18   Ht 6\' 2"  (1.88 m)   Wt 220 lb (99.8 kg)   SpO2 96%   BMI 28.25 kg/m   Visual Acuity Right Eye Distance:   Left Eye Distance:   Bilateral Distance:    Right Eye Near:   Left Eye Near:    Bilateral Near:     Physical Exam Vitals and nursing note reviewed.  Constitutional:       General: She is not in acute distress.    Appearance: She is well-developed.  HENT:     Head: Normocephalic and atraumatic.  Eyes:     Conjunctiva/sclera: Conjunctivae normal.  Neck:     Comments: Right trapezius tenderness to palpation with muscle spasm noted.  Mild TTP right biceps  insertion. Cardiovascular:     Rate and Rhythm: Normal rate and regular rhythm.     Heart sounds: No murmur heard. Pulmonary:     Effort: Pulmonary effort is normal. No respiratory distress.     Breath sounds: Normal breath sounds.  Abdominal:     Palpations: Abdomen is soft.     Tenderness: There is no abdominal tenderness.  Musculoskeletal:        General: No swelling.     Cervical back: Neck supple.  Skin:    General: Skin is warm and dry.     Capillary Refill: Capillary refill takes less than 2 seconds.  Neurological:     Mental Status: She is alert.  Psychiatric:        Mood and Affect: Mood normal.      UC Treatments / Results  Labs (all labs ordered are listed, but only abnormal results are displayed) Labs Reviewed - No data to display  EKG   Radiology No results found.  Procedures Procedures (including critical care time)  Medications Ordered in UC Medications - No data to display  Initial Impression / Assessment and Plan / UC Course  I have reviewed the triage vital signs and the nursing notes.  Pertinent labs & imaging results that were available during my care of the patient were reviewed by me and considered in my medical decision making (see chart for details).     Will prescribe Xarelto take as needed.  Supportive care discussed.  Strengthening stretching exercise given.  Advised to follow-up with orthopedics if no improvement. Final Clinical Impressions(s) / UC Diagnoses   Final diagnoses:  Biceps strain, right, subsequent encounter  Muscle spasm     Discharge Instructions      Can take muscle relaxer as needed Can continue with Aleve and Tylenol Apply  ice to affected area Recommend stretching exercises     ED Prescriptions     Medication Sig Dispense Auth. Provider   cyclobenzaprine (FLEXERIL) 10 MG tablet Take 1 tablet (10 mg total) by mouth 2 (two) times daily as needed for muscle spasms. 20 tablet Ward, Tylene Fantasia, PA-C      PDMP not reviewed this encounter.   Ward, Tylene Fantasia, PA-C 02/16/23 1136

## 2023-02-16 NOTE — Discharge Instructions (Addendum)
Can take muscle relaxer as needed Can continue with Aleve and Tylenol Apply ice to affected area Recommend stretching exercises

## 2023-02-16 NOTE — ED Triage Notes (Signed)
"  I was here about a month or so ago for right elbow pain, still having trouble with this". This time the pain is radiating up right arm to shoulder.

## 2023-03-14 ENCOUNTER — Telehealth: Payer: Self-pay | Admitting: Family Medicine

## 2023-03-14 NOTE — Telephone Encounter (Signed)
Called patient to let her know we need to reschedule appointmenrt due to provider being out of office

## 2023-03-20 ENCOUNTER — Ambulatory Visit: Payer: PRIVATE HEALTH INSURANCE | Admitting: Family Medicine

## 2023-04-02 ENCOUNTER — Ambulatory Visit: Payer: PRIVATE HEALTH INSURANCE | Admitting: Family Medicine

## 2023-04-02 ENCOUNTER — Encounter: Payer: Self-pay | Admitting: Family Medicine

## 2023-04-02 VITALS — BP 127/77 | HR 92 | Resp 16 | Wt 214.8 lb

## 2023-04-02 DIAGNOSIS — F411 Generalized anxiety disorder: Secondary | ICD-10-CM

## 2023-04-02 DIAGNOSIS — M25521 Pain in right elbow: Secondary | ICD-10-CM

## 2023-04-02 DIAGNOSIS — I10 Essential (primary) hypertension: Secondary | ICD-10-CM | POA: Diagnosis not present

## 2023-04-02 DIAGNOSIS — F17210 Nicotine dependence, cigarettes, uncomplicated: Secondary | ICD-10-CM

## 2023-04-02 DIAGNOSIS — F17200 Nicotine dependence, unspecified, uncomplicated: Secondary | ICD-10-CM

## 2023-04-02 DIAGNOSIS — E785 Hyperlipidemia, unspecified: Secondary | ICD-10-CM

## 2023-04-02 MED ORDER — CLONAZEPAM 0.5 MG PO TABS: 0.5000 mg | ORAL_TABLET | Freq: Two times a day (BID) | ORAL | 0 refills | Status: DC | PRN

## 2023-04-03 ENCOUNTER — Encounter: Payer: Self-pay | Admitting: Family Medicine

## 2023-04-03 NOTE — Progress Notes (Signed)
Established Patient Office Visit  Subjective    Patient ID: Paige Woods, female    DOB: 1960-08-09  Age: 62 y.o. MRN: [id number]  CC:  Chief Complaint  Patient presents with   Medical Management of Chronic Issues    HPI Paige Woods presents for follow up of chronic med issues. 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   Outpatient Encounter Medications as of 04/02/2023  Medication Sig   amLODipine (NORVASC) 5 MG tablet Take 5 mg by mouth daily.   aspirin 81 MG EC tablet Take by mouth.   citalopram (CELEXA) 40 MG tablet Take 1 tablet (40 mg total) by mouth daily.   cyclobenzaprine (FLEXERIL) 10 MG tablet Take 1 tablet (10 mg total) by mouth 2 (two) times daily as needed for muscle spasms.   rosuvastatin (CRESTOR) 40 MG tablet Take 1 tablet (40 mg total) by mouth daily.   [DISCONTINUED] amLODipine (NORVASC) 2.5 MG tablet Take 2 tablets (5 mg total) by mouth daily.   [DISCONTINUED] clonazePAM (KLONOPIN) 0.5 MG tablet Take 1 tablet (0.5 mg total) by mouth 2 (two) times daily as needed for anxiety.   clonazePAM (KLONOPIN) 0.5 MG tablet Take 1 tablet (0.5 mg total) by mouth 2 (two) times daily as needed for anxiety.   [DISCONTINUED] lisinopril-hydrochlorothiazide (PRINZIDE,ZESTORETIC) 10-12.5 MG per tablet Take 1 tablet by mouth daily.   [DISCONTINUED] LORazepam (ATIVAN) 1 MG tablet Take 1 tablet by mouth 2 (two) times daily as needed for anxiety.   No facility-administered encounter medications on file as of 04/02/2023.    Past Medical History:  Diagnosis Date   Acute medial meniscal tear    left   Anxiety    Arthritis    Complication of anesthesia    PONV   Depression    Hypertension     Past Surgical History:  Procedure Laterality Date   ANKLE SURGERY Right    CESAREAN SECTION     CHOLECYSTECTOMY     CHONDROPLASTY Left 11/16/2014   Procedure: CHONDROPLASTY;  Surgeon: Gean Birchwood, MD;  Location: [facility];  Service: Orthopedics;  Laterality: Left;   KNEE ARTHROSCOPY WITH DRILLING/MICROFRACTURE Left 11/16/2014   Procedure: KNEE ARTHROSCOPY WITH DRILLING/MICROFRACTURE;  Surgeon: Gean Birchwood, MD;  Location: [facility];  Service: Orthopedics;  Laterality: Left;   KNEE SURGERY Left    SHOULDER ARTHROSCOPY Left     Family History  Problem Relation Age of Onset   Diabetes Mother    CAD Mother     Social History   Socioeconomic History   Marital status: Divorced    Spouse name: Not on file   Number of children: Not on file   Years of education: Not on file   Highest education level: Not on file  Occupational History   Not on file  Tobacco Use   Smoking status: Every Day    Current packs/day: 0.00    Types: Cigarettes    Last attempt to quit: 10/12/2014    Years since quitting: 8.4   Smokeless tobacco: Never  Vaping Use   Vaping status: Never Used  Substance and Sexual Activity   Alcohol use: No   Drug use: No   Sexual activity: Yes  Other Topics Concern   Not on file  Social History Narrative   Not on file   Social Determinants of Health   Financial Resource Strain: Patient Unable To Answer (04/02/2023)   Overall Financial  Resource Strain (CARDIA)    Difficulty of Paying Living Expenses: Patient unable to answer  Food Insecurity: Medium Risk (08/28/2022)   [facility]   Hunger Vital Sign    Worried About Running Out of Food in the Last Year: Sometimes true    Ran Out of Food in the Last Year: Sometimes true  Transportation Needs: Not on file (08/28/2022)  Physical Activity: Patient Unable To Answer (04/02/2023)   Exercise Vital Sign    Days of Exercise per Week: Patient unable to answer    Minutes of Exercise per Session: Patient unable to answer  Stress: Patient Unable To Answer (04/02/2023)   [employer] of Occupational Health - Occupational Stress Questionnaire    Feeling of Stress : Patient unable to  answer  Social Connections: Patient Unable To Answer (04/02/2023)   Social Connection and Isolation Panel [NHANES]    Frequency of Communication with Friends and Family: Patient unable to answer    Frequency of Social Gatherings with Friends and Family: Patient unable to answer    Attends Religious Services: Patient unable to answer    Active Member of Clubs or Organizations: Patient unable to answer    Attends Banker Meetings: Patient unable to answer    Marital Status: Patient unable to answer  Intimate Partner Violence: Not on file    Review of Systems  All other systems reviewed and are negative.       Objective    BP 127/77   Pulse 92   Resp 16   Wt 214 lb 12.8 oz (97.4 kg)   SpO2 96%   BMI 27.58 kg/m   Physical Exam Vitals and nursing note reviewed.  Constitutional:      General: She is not in acute distress. Cardiovascular:     Rate and Rhythm: Normal rate and regular rhythm.  Pulmonary:     Effort: Pulmonary effort is normal.     Breath sounds: Normal breath sounds.  Abdominal:     Palpations: Abdomen is soft.     Tenderness: There is no abdominal tenderness.  Musculoskeletal:     Right elbow: No swelling or deformity. Tenderness present.  Neurological:     General: No focal deficit present.     Mental Status: She is alert and oriented to person, place, and time.         Assessment & Plan:  1. Hypertension, benign Appears stable. Continue   2. Hyperlipidemia, unspecified hyperlipidemia type Continue   3. Anxiety state Clonazepam refilled.   4. Right elbow pain Utilize tylenol/nsaids/topicals prn. Exercises given. Patient defers further eval/mgt at this time  5. Tobacco use disorder Discussed reduction/cessation    Return in about 3 months (around 07/03/2023).   Tommie Raymond, MD

## 2023-04-12 ENCOUNTER — Other Ambulatory Visit: Payer: Self-pay | Admitting: Family Medicine

## 2023-04-21 ENCOUNTER — Encounter: Payer: Self-pay | Admitting: Family Medicine

## 2023-04-21 ENCOUNTER — Ambulatory Visit: Payer: Self-pay

## 2023-04-21 ENCOUNTER — Telehealth: Payer: BC Managed Care – PPO | Admitting: Family Medicine

## 2023-04-21 DIAGNOSIS — B379 Candidiasis, unspecified: Secondary | ICD-10-CM

## 2023-04-21 DIAGNOSIS — J209 Acute bronchitis, unspecified: Secondary | ICD-10-CM | POA: Diagnosis not present

## 2023-04-21 DIAGNOSIS — T3695XA Adverse effect of unspecified systemic antibiotic, initial encounter: Secondary | ICD-10-CM

## 2023-04-21 MED ORDER — AMOXICILLIN-POT CLAVULANATE 875-125 MG PO TABS
1.0000 | ORAL_TABLET | Freq: Two times a day (BID) | ORAL | 0 refills | Status: AC
Start: 2023-04-21 — End: 2023-04-28

## 2023-04-21 MED ORDER — PROMETHAZINE-DM 6.25-15 MG/5ML PO SYRP
5.0000 mL | ORAL_SOLUTION | Freq: Four times a day (QID) | ORAL | 0 refills | Status: AC | PRN
Start: 2023-04-21 — End: ?

## 2023-04-21 MED ORDER — FLUCONAZOLE 150 MG PO TABS
150.0000 mg | ORAL_TABLET | Freq: Every day | ORAL | 0 refills | Status: AC
Start: 2023-04-21 — End: ?

## 2023-04-21 NOTE — Progress Notes (Signed)
Virtual Visit Consent   Paige Woods, you are scheduled for a virtual visit with a [facility] provider today. Just as with appointments in the office, your consent must be obtained to participate. Your consent will be active for this visit and any virtual visit you may have with one of our providers in the next 365 days. If you have a MyChart account, a copy of this consent can be sent to you electronically.  As this is a virtual visit, video technology does not allow for your provider to perform a traditional examination. This may limit your provider's ability to fully assess your condition. If your provider identifies any concerns that need to be evaluated in person or the need to arrange testing (such as labs, EKG, etc.), we will make arrangements to do so. Although advances in technology are sophisticated, we cannot ensure that it will always work on either your end or our end. If the connection with a video visit is poor, the visit may have to be switched to a telephone visit. With either a video or telephone visit, we are not always able to ensure that we have a secure connection.  By engaging in this virtual visit, you consent to the provision of healthcare and authorize for your insurance to be billed (if applicable) for the services provided during this visit. Depending on your insurance coverage, you may receive a charge related to this service.  I need to obtain your verbal consent now. Are you willing to proceed with your visit today? Paige Woods has provided verbal consent on 04/21/2023 for a virtual visit (video or telephone). Freddy Finner, NP  Date: 04/21/2023 10:13 AM  Virtual Visit via Video Note   I, Freddy Finner, connected with  Paige Woods  ([id number], Jun 27, 1961) on 04/21/23 at 10:15 AM EDT by a video-enabled telemedicine application and verified that I am speaking with the correct person using two identifiers.  Location: Patient: Virtual Visit Location  Patient: Home Provider: Virtual Visit Location Provider: Home Office   I discussed the limitations of evaluation and management by telemedicine and the availability of in person appointments. The patient expressed understanding and agreed to proceed.    History of Present Illness: Paige Woods is a 62 y.o. who identifies as a female who was assigned female at birth, and is being seen today for cough  Onset was last Thursday- with cough first Associated symptoms are chest tightness, but no active chest pain, little to no production in cough,  Reported history of mild intermittent asthma and bronchitis, is a smoker, and reports this occurs at least once a year. Modifying factors are OTC cough medication- robitussin-DM, mucinex and nyquil without relief, inhalers using rescue and are helping some Denies chest pain, shortness of breath, fevers, chills  Exposure to sick contacts- known- works in the healthcare- RN  COVID test: neg Vaccines: Flu two weeks ago   Problems:  Patient Active Problem List   Diagnosis Date Noted   Vitamin D deficiency 09/05/2022   Chronic left-sided low back pain with sciatica 02/28/2022   Stenosis of left carotid artery 02/28/2022   Chronic knee pain after total replacement of right knee joint 10/05/2021   Muscle spasm of back 05/11/2019   Sciatica of left side 05/11/2019   Malaise 07/11/2017   Precordial pain 07/11/2017   Pure hypercholesterolemia 07/11/2017   CKD (chronic kidney disease) stage 3, GFR 30-59 ml/min (HCC) 11/21/2016   Risk for falls 04/12/2016   Anxiety  state 10/11/2013   Hyperlipidemia 10/11/2013   Hypertension, benign 10/11/2013   Obstructive chronic bronchitis (HCC) 04/22/2013   Breast calcification seen on mammogram 03/31/2013   Tobacco use disorder 03/31/2013   Depressive disorder 12/05/2012   Insomnia 12/05/2012   Mild intermittent asthma with status asthmaticus 12/05/2012   Obesity 12/05/2012   Periodic headache syndrome, not  intractable 12/05/2012    Allergies:  Allergies  Allergen Reactions   Tramadol Nausea And Vomiting   Ketoprofen Nausea And Vomiting   Medications:  Current Outpatient Medications:    amLODipine (NORVASC) 5 MG tablet, Take 5 mg by mouth daily., Disp: , Rfl:    aspirin 81 MG EC tablet, Take by mouth., Disp: , Rfl:    citalopram (CELEXA) 40 MG tablet, Take 1 tablet (40 mg total) by mouth daily., Disp: 90 tablet, Rfl: 0   clonazePAM (KLONOPIN) 0.5 MG tablet, Take 1 tablet (0.5 mg total) by mouth 2 (two) times daily as needed for anxiety., Disp: 30 tablet, Rfl: 0   cyclobenzaprine (FLEXERIL) 10 MG tablet, Take 1 tablet (10 mg total) by mouth 2 (two) times daily as needed for muscle spasms., Disp: 20 tablet, Rfl: 0   rosuvastatin (CRESTOR) 40 MG tablet, TAKE 1 TABLET BY MOUTH EVERY DAY, Disp: 90 tablet, Rfl: 0  Observations/Objective: Patient is well-developed, well-nourished in no acute distress.  Resting comfortably  at home.  Head is normocephalic, atraumatic.  No labored breathing.  Speech is clear and coherent with logical content.  Patient is alert and oriented at baseline.  Cough  Assessment and Plan:  1. Acute bronchitis, unspecified organism  - promethazine-dextromethorphan (PROMETHAZINE-DM) 6.25-15 MG/5ML syrup; Take 5 mLs by mouth 4 (four) times daily as needed for cough.  Dispense: 118 mL; Refill: 0 - amoxicillin-clavulanate (AUGMENTIN) 875-125 MG tablet; Take 1 tablet by mouth 2 (two) times daily for 7 days.  Dispense: 14 tablet; Refill: 0   2. Antibiotic-induced yeast infection  - fluconazole (DIFLUCAN) 150 MG tablet; Take 1 tablet (150 mg total) by mouth daily.  Dispense: 1 tablet; Refill: 0   - Take meds as prescribed - Rest voice - Use a cool mist humidifier especially during the winter months when heat dries out the air. - Use saline nose sprays frequently to help soothe nasal passages if they are drying out. - Stay hydrated by drinking plenty of fluids - Keep  thermostat turn down low to prevent drying out which can cause a dry cough. - For fever or aches or pains- take tylenol or ibuprofen as directed on bottle             * for fevers greater than 101 orally you may alternate ibuprofen and tylenol every 3 hours.  If you do not improve you will need a follow up visit in person.               Reviewed side effects, risks and benefits of medication.    Patient acknowledged agreement and understanding of the plan.   Past Medical, Surgical, Social History, Allergies, and Medications have been Reviewed.     Follow Up Instructions: I discussed the assessment and treatment plan with the patient. The patient was provided an opportunity to ask questions and all were answered. The patient agreed with the plan and demonstrated an understanding of the instructions.  A copy of instructions were sent to the patient via MyChart unless otherwise noted below.     The patient was advised to call back or seek an in-person evaluation if  the symptoms worsen or if the condition fails to improve as anticipated.    Freddy Finner, NP

## 2023-04-21 NOTE — Telephone Encounter (Signed)
  Chief Complaint: Cough Symptoms: Dry cough - has brought up a small amount of grey phelgm Frequency: A few days Pertinent Negatives: Patient denies Fever Disposition: [] ED /[] Urgent Care (no appt availability in office) / [] Appointment(In office/virtual)/ [x]  [facility]/ [] Home Care/ [] Refused Recommended Disposition /[] [facility]/ []  Follow-up with PCP Additional Notes: Pt has hx of bronchitis. She states that she gets something like this once a year. Dry frequent cough. Hx of asthma. No appts in office - scheduled VV.  Summary: cough and congestion/medication request   Patient is experiencing a bad no productive cough. She has been taking over the counter meds with no relief. No appts until Nov and patient needs something to relief her symptoms. Please f/u with patient     Reason for Disposition  [1] MILD difficulty breathing (e.g., minimal/no SOB at rest, SOB with walking, pulse <100) AND [2] still present when not coughing  Answer Assessment - Initial Assessment Questions 1. ONSET: "When did the cough begin?"      Few days 2. SEVERITY: "How bad is the cough today?"      Moderate 3. SPUTUM: "Describe the color of your sputum" (none, dry cough; clear, white, yellow, green)     White/grey 4. HEMOPTYSIS: "Are you coughing up any blood?" If so ask: "How much?" (flecks, streaks, tablespoons, etc.)     no 5. DIFFICULTY BREATHING: "Are you having difficulty breathing?" If Yes, ask: "How bad is it?" (e.g., mild, moderate, severe)    - MILD: No SOB at rest, mild SOB with walking, speaks normally in sentences, can lie down, no retractions, pulse < 100.    - MODERATE: SOB at rest, SOB with minimal exertion and prefers to sit, cannot lie down flat, speaks in phrases, mild retractions, audible wheezing, pulse 100-120.    - SEVERE: Very SOB at rest, speaks in single words, struggling to breathe, sitting hunched forward, retractions, pulse > 120      moderate 6. FEVER: "Do  you have a fever?" If Yes, ask: "What is your temperature, how was it measured, and when did it start?"     no 7. CARDIAC HISTORY: "Do you have any history of heart disease?" (e.g., heart attack, congestive heart failure)      no 8. LUNG HISTORY: "Do you have any history of lung disease?"  (e.g., pulmonary embolus, asthma, emphysema)     Asthma  10. OTHER SYMPTOMS: "Do you have any other symptoms?" (e.g., runny nose, wheezing, chest pain)       no  Protocols used: Cough - Acute Non-Productive-A-AH

## 2023-04-21 NOTE — Patient Instructions (Signed)
Paige Woods, thank you for joining Freddy Finner, NP for today's virtual visit.  While this provider is not your primary care provider (PCP), if your PCP is located in our provider database this encounter information will be shared with them immediately following your visit.   A [facility] MyChart account gives you access to today's visit and all your visits, tests, and labs performed at [facility] " click here if you don't have a [facility] MyChart account or go to [email or web address]  Consent: (Patient) Paige Woods provided verbal consent for this virtual visit at the beginning of the encounter.  Current Medications:  Current Outpatient Medications:    amoxicillin-clavulanate (AUGMENTIN) 875-125 MG tablet, Take 1 tablet by mouth 2 (two) times daily for 7 days., Disp: 14 tablet, Rfl: 0   fluconazole (DIFLUCAN) 150 MG tablet, Take 1 tablet (150 mg total) by mouth daily., Disp: 1 tablet, Rfl: 0   promethazine-dextromethorphan (PROMETHAZINE-DM) 6.25-15 MG/5ML syrup, Take 5 mLs by mouth 4 (four) times daily as needed for cough., Disp: 118 mL, Rfl: 0   amLODipine (NORVASC) 5 MG tablet, Take 5 mg by mouth daily., Disp: , Rfl:    aspirin 81 MG EC tablet, Take by mouth., Disp: , Rfl:    citalopram (CELEXA) 40 MG tablet, Take 1 tablet (40 mg total) by mouth daily., Disp: 90 tablet, Rfl: 0   clonazePAM (KLONOPIN) 0.5 MG tablet, Take 1 tablet (0.5 mg total) by mouth 2 (two) times daily as needed for anxiety., Disp: 30 tablet, Rfl: 0   cyclobenzaprine (FLEXERIL) 10 MG tablet, Take 1 tablet (10 mg total) by mouth 2 (two) times daily as needed for muscle spasms., Disp: 20 tablet, Rfl: 0   rosuvastatin (CRESTOR) 40 MG tablet, TAKE 1 TABLET BY MOUTH EVERY DAY, Disp: 90 tablet, Rfl: 0   Medications ordered in this encounter:  Meds ordered this encounter  Medications   promethazine-dextromethorphan (PROMETHAZINE-DM) 6.25-15 MG/5ML syrup    Sig: Take 5 mLs by mouth 4 (four)  times daily as needed for cough.    Dispense:  118 mL    Refill:  0    Order Specific Question:   Supervising Provider    Answer:   Merrilee Jansky [id number]   amoxicillin-clavulanate (AUGMENTIN) 875-125 MG tablet    Sig: Take 1 tablet by mouth 2 (two) times daily for 7 days.    Dispense:  14 tablet    Refill:  0    Order Specific Question:   Supervising Provider    Answer:   Merrilee Jansky [[id number]]   fluconazole (DIFLUCAN) 150 MG tablet    Sig: Take 1 tablet (150 mg total) by mouth daily.    Dispense:  1 tablet    Refill:  0    Order Specific Question:   Supervising Provider    Answer:   Merrilee Jansky [id number]     *If you need refills on other medications prior to your next appointment, please contact your pharmacy*  Follow-Up: Call back or seek an in-person evaluation if the symptoms worsen or if the condition fails to improve as anticipated.  [facility] [phone]  Other Instructions - Take meds as prescribed - Rest voice - Use a cool mist humidifier especially during the winter months when heat dries out the air. - Use saline nose sprays frequently to help soothe nasal passages if they are drying out. - Stay hydrated by drinking plenty of fluids - Keep thermostat turn down low  to prevent drying out which can cause a dry cough.  - For fever or aches or pains- take tylenol or ibuprofen as directed on bottle             * for fevers greater than 101 orally you may alternate ibuprofen and tylenol every 3 hours.  If you do not improve you will need a follow up visit in person.                   If you have been instructed to have an in-person evaluation today at a local Urgent Care facility, please use the link below. It will take you to a list of all of our available [facility], including address, phone number and hours of operation. Please do not delay care.  [facility]  If you or a family member do not have a  primary care provider, use the link below to schedule a visit and establish care. When you choose a [facility] primary care physician or advanced practice provider, you gain a long-term partner in health. Find a Primary Care Provider  Learn more about [facility] in-office and virtual care options: [facility] - Get Care Now

## 2023-04-21 NOTE — Telephone Encounter (Signed)
Called pt; pt has a telehealth appt scheduled for today for cough concerns

## 2023-05-02 ENCOUNTER — Other Ambulatory Visit: Payer: Self-pay | Admitting: Family Medicine

## 2023-05-02 MED ORDER — AMLODIPINE BESYLATE 5 MG PO TABS: 5.0000 mg | ORAL_TABLET | Freq: Every day | ORAL | 0 refills | Status: DC

## 2023-05-02 NOTE — Telephone Encounter (Signed)
Requested Prescriptions  Pending Prescriptions Disp Refills   amLODipine (NORVASC) 5 MG tablet 90 tablet     Sig: Take 1 tablet (5 mg total) by mouth daily.     Cardiovascular: Calcium Channel Blockers 2 Passed - 05/02/2023  9:25 AM      Passed - Last BP in normal range    BP Readings from Last 1 Encounters:  04/02/23 127/77         Passed - Last Heart Rate in normal range    Pulse Readings from Last 1 Encounters:  04/02/23 92         Passed - Valid encounter within last 6 months    Recent Outpatient Visits           1 month ago Hypertension, benign   [facility], MD   3 months ago Hypertension, benign   [facility], MD       Future Appointments             In 2 months Georganna Skeans, MD [facility]

## 2023-05-02 NOTE — Telephone Encounter (Signed)
Medication Refill - Medication: amLODipine (NORVASC) 5 MG tablet [[id number]]   Pt took last pill this morning.  Has the patient contacted their pharmacy? Yes.   (Agent: If no, request that the patient contact the pharmacy for the refill. If patient does not wish to contact the pharmacy document the reason why and proceed with request.) (Agent: If yes, when and what did the pharmacy advise?)  Preferred Pharmacy (with phone number or street name):  [id number] [place], [place] - [address] AT [facility] Phone: [phone]  Fax: [phone]     Has the patient been seen for an appointment in the last year OR does the patient have an upcoming appointment? Yes.    Agent: Please be advised that RX refills may take up to 3 business days. We ask that you follow-up with your pharmacy.

## 2023-05-09 ENCOUNTER — Other Ambulatory Visit: Payer: Self-pay | Admitting: Family Medicine

## 2023-05-14 ENCOUNTER — Other Ambulatory Visit: Payer: Self-pay | Admitting: Family Medicine

## 2023-05-15 ENCOUNTER — Telehealth: Payer: Self-pay | Admitting: Family Medicine

## 2023-05-15 NOTE — Telephone Encounter (Signed)
Medication Refill -  Most Recent Primary Care Visit:  Provider: Georganna Skeans  Department: [facility]  Visit Type: OFFICE VISIT  Date: 04/02/2023  Medication: clonazePAM (KLONOPIN) 0.5 MG tablet [[id number]  citalopram (CELEXA) 40 MG tablet [[id number]]  Has the patient contacted their pharmacy? Yes ( Is this the correct pharmacy for this prescription? Yes If no, delete pharmacy and type the correct one.  This is the patient's preferred pharmacy:  [facility] [address] [place] - [address] [place] [address] Phone: [phone] Fax: [phone]   Has the prescription been filled recently? Yes  Is the patient out of the medication?  Yes to celexa and clonazePAM she has 6 pills left.... Has the patient been seen for an appointment in the last year OR does the patient have an upcoming appointment? Yes  Can we respond through MyChart? Yes  Agent: Please be advised that Rx refills may take up to 3 business days. We ask that you follow-up with your pharmacy.

## 2023-05-15 NOTE — Telephone Encounter (Signed)
Citalopram was sent to the pharmacy yesterday in a separate refill encounter. Clonazepam requested on yesterday by the pharmacy in a separate refill encounter that was routed to the provider for refill, still pending.

## 2023-06-09 ENCOUNTER — Other Ambulatory Visit: Payer: Self-pay | Admitting: Family Medicine

## 2023-06-24 DIAGNOSIS — M25521 Pain in right elbow: Secondary | ICD-10-CM | POA: Diagnosis not present

## 2023-06-24 DIAGNOSIS — I10 Essential (primary) hypertension: Secondary | ICD-10-CM | POA: Diagnosis not present

## 2023-06-24 DIAGNOSIS — S39012A Strain of muscle, fascia and tendon of lower back, initial encounter: Secondary | ICD-10-CM | POA: Diagnosis not present

## 2023-06-24 DIAGNOSIS — E7800 Pure hypercholesterolemia, unspecified: Secondary | ICD-10-CM | POA: Diagnosis not present

## 2023-07-06 ENCOUNTER — Other Ambulatory Visit: Payer: Self-pay | Admitting: Family Medicine

## 2023-07-09 ENCOUNTER — Ambulatory Visit: Payer: PRIVATE HEALTH INSURANCE | Admitting: Family Medicine
